# Patient Record
Sex: Male | Born: 1975 | Hispanic: Yes | Marital: Married | Smoking: Former smoker
Health system: Southern US, Community
[De-identification: ages and names within clinical notes are randomized; demographics above are authoritative.]

## PROBLEM LIST (undated history)

## (undated) DIAGNOSIS — G510 Bell's palsy: Secondary | ICD-10-CM

## (undated) HISTORY — PX: OTHER SURGICAL HISTORY: SHX169

## (undated) HISTORY — PX: ANKLE SURGERY: SHX546

---

## 2017-04-19 ENCOUNTER — Encounter (HOSPITAL_COMMUNITY): Payer: Self-pay | Admitting: Emergency Medicine

## 2017-04-19 ENCOUNTER — Emergency Department (HOSPITAL_COMMUNITY)
Admission: EM | Admit: 2017-04-19 | Discharge: 2017-04-20 | Disposition: A | Discharge status: Home / Self Care | Payer: Self-pay | Attending: Emergency Medicine | Admitting: Emergency Medicine

## 2017-04-19 ENCOUNTER — Emergency Department (HOSPITAL_COMMUNITY): Payer: Self-pay

## 2017-04-19 ENCOUNTER — Other Ambulatory Visit: Payer: Self-pay

## 2017-04-19 DIAGNOSIS — M79644 Pain in right finger(s): Secondary | ICD-10-CM | POA: Insufficient documentation

## 2017-04-19 NOTE — ED Triage Notes (Signed)
Pt states that his middle finger on his R hand became swollen and red. Denies any injury.

## 2017-04-20 MED ORDER — IBUPROFEN 800 MG PO TABS
800.0000 mg | ORAL_TABLET | Freq: Once | ORAL | Status: AC
  Administered 2017-04-20: 800 mg via ORAL
  Filled 2017-04-20: qty 1

## 2017-04-20 MED ORDER — NAPROXEN 375 MG PO TABS: 375.0000 mg | ORAL_TABLET | Freq: Two times a day (BID) | ORAL | 0 refills | Status: DC

## 2017-04-20 NOTE — ED Provider Notes (Signed)
[facility] EMERGENCY DEPARTMENT Provider Note   CSN: [id number] Arrival date & time: 04/19/17  2119     History   Chief Complaint Chief Complaint  Patient presents with  . Hand Pain    HPI Brandon Woods is a 42 y.o. male.  Patient presents with sudden onset of right middle finger pain about 1400 on 04/19/17.Marland Kitchen No known injury. Works as a Financial risk analyst. Pain primarily along the middle and distal phalanx. No increased warmth, redness, or swelling.  The history is provided by the patient. No language interpreter was used.  Hand Pain  This is a new problem. The current episode started 6 to 12 hours ago. The problem has not changed since onset.   History reviewed. No pertinent past medical history.  There are no active problems to display for this patient.   History reviewed. No pertinent surgical history.      Home Medications    Prior to Admission medications   Not on File    Family History No family history on file.  Social History Social History   Tobacco Use  . Smoking status: Never Smoker  . Smokeless tobacco: Never Used  Substance Use Topics  . Alcohol use: Not Currently  . Drug use: Not Currently     Allergies   Patient has no known allergies.   Review of Systems Review of Systems  Musculoskeletal: Positive for arthralgias.  All other systems reviewed and are negative.    Physical Exam Updated Vital Signs BP 121/75 (BP Location: Left Arm)   Pulse 67   Temp 98.8 F (37.1 C) (Oral)   Resp 18   SpO2 100%   Physical Exam  Constitutional: He is oriented to person, place, and time. He appears well-developed and well-nourished.  HENT:  Head: Normocephalic.  Eyes: Conjunctivae are normal.  Neck: Normal range of motion.  Cardiovascular: Normal rate and regular rhythm.  Pulmonary/Chest: Effort normal and breath sounds normal.  Abdominal: Soft. Bowel sounds are normal.  Musculoskeletal: Normal range of motion. He exhibits  tenderness. He exhibits no edema.       Right hand: He exhibits tenderness. He exhibits normal range of motion. Normal sensation noted. Normal strength noted.       Hands: Neurological: He is alert and oriented to person, place, and time.  Skin: Skin is warm and dry.  Nursing note and vitals reviewed.    ED Treatments / Results  Labs (all labs ordered are listed, but only abnormal results are displayed) Labs Reviewed - No data to display  EKG None  Radiology Dg Finger Middle Right  Result Date: 04/19/2017 CLINICAL DATA:  Pain rt middle finger today, NKI EXAM: RIGHT MIDDLE FINGER 2+V COMPARISON:  None. FINDINGS: No fracture dislocation of the middle digit.  No foreign body. IMPRESSION: No acute osseous abnormality. Electronically Signed   By: Genevive Bi M.D.   On: 04/19/2017 22:31    Procedures Procedures (including critical care time)  Medications Ordered in ED Medications - No data to display   Initial Impression / Assessment and Plan / ED Course  I have reviewed the triage vital signs and the nursing notes.  Pertinent labs & imaging results that were available during my care of the patient were reviewed by me and considered in my medical decision making (see chart for details).     Patient X-Ray negative for obvious fracture or dislocation.  Pt advised to follow up with orthopedics if symptoms do not improve with treatment. Patient given splint  while in ED, conservative therapy recommended and discussed. Patient will be discharged home & is agreeable with above plan. Returns precautions discussed. Pt appears safe for discharge.  Final Clinical Impressions(s) / ED Diagnoses   Final diagnoses:  Pain in finger of right hand    ED Discharge Orders        Ordered    naproxen (NAPROSYN) 375 MG tablet  2 times daily     04/20/17 0027       Felicie MornSmith, Keviana Guida, NP 04/20/17 [id number]    Shon BatonHorton, Courtney F, MD 04/20/17 [phone]

## 2017-04-20 NOTE — Discharge Instructions (Signed)
Take medication as directed. Follow-up with the provider listed if no improvement in symptoms despite treatment.

## 2017-05-01 ENCOUNTER — Other Ambulatory Visit: Payer: Self-pay

## 2017-05-01 ENCOUNTER — Encounter (HOSPITAL_COMMUNITY): Payer: Self-pay | Admitting: *Deleted

## 2017-05-01 ENCOUNTER — Emergency Department (HOSPITAL_COMMUNITY)
Admission: EM | Admit: 2017-05-01 | Discharge: 2017-05-01 | Disposition: A | Discharge status: Home / Self Care | Payer: No Typology Code available for payment source | Attending: Emergency Medicine | Admitting: Emergency Medicine

## 2017-05-01 DIAGNOSIS — W268XXA Contact with other sharp object(s), not elsewhere classified, initial encounter: Secondary | ICD-10-CM | POA: Insufficient documentation

## 2017-05-01 DIAGNOSIS — S61210A Laceration without foreign body of right index finger without damage to nail, initial encounter: Secondary | ICD-10-CM | POA: Diagnosis not present

## 2017-05-01 DIAGNOSIS — Y990 Civilian activity done for income or pay: Secondary | ICD-10-CM | POA: Diagnosis not present

## 2017-05-01 DIAGNOSIS — Z23 Encounter for immunization: Secondary | ICD-10-CM | POA: Diagnosis not present

## 2017-05-01 DIAGNOSIS — Y9389 Activity, other specified: Secondary | ICD-10-CM | POA: Insufficient documentation

## 2017-05-01 DIAGNOSIS — Y9289 Other specified places as the place of occurrence of the external cause: Secondary | ICD-10-CM | POA: Diagnosis not present

## 2017-05-01 DIAGNOSIS — S6991XA Unspecified injury of right wrist, hand and finger(s), initial encounter: Secondary | ICD-10-CM | POA: Diagnosis present

## 2017-05-01 MED ORDER — LIDOCAINE HCL (PF) 1 % IJ SOLN: 5.0000 mL | Freq: Once | INTRAMUSCULAR | Status: DC

## 2017-05-01 MED ORDER — TETANUS-DIPHTH-ACELL PERTUSSIS 5-2.5-18.5 LF-MCG/0.5 IM SUSP
0.5000 mL | Freq: Once | INTRAMUSCULAR | Status: AC
  Administered 2017-05-01: 0.5 mL via INTRAMUSCULAR
  Filled 2017-05-01: qty 0.5

## 2017-05-01 NOTE — Discharge Instructions (Addendum)
Get help right away if: Your wound reopens and is draining. Your wound becomes red, swollen, hot, or tender. You develop a rash after the glue is applied. You have increasing pain in the wound. You have a red streak going away from the wound. You have pus coming from the wound. You have increased bleeding. You have a fever. You have shaking chills. You notice a bad smell coming from the wound. Your wound or the adhesive breaks open. 

## 2017-05-01 NOTE — ED Notes (Signed)
Patient here with small laceration to right index finger at nailbed after cutting on metal frame 6 hours ago, no bleeding, full sensation

## 2017-05-01 NOTE — ED Provider Notes (Signed)
[facility] EMERGENCY DEPARTMENT Provider Note   CSN: [id number] Arrival date & time: 05/01/17  6440     History   Chief Complaint Chief Complaint  Patient presents with  . Laceration    HPI Brandon Woods is a 42 y.o. male who presents with cc of laceration to the right index finger.  This occurred about 4 hours prior to arrival.  Patient was at work and sliced his right index finger on a metal edge.  He cleaned the wound and applied direct pressure.  He is unsure of his last tetanus vaccination.  Bleeding is currently controlled.  He denies any numbness or tingling.  He denies any weakness, easy bruising or bleeding.  HPI  History reviewed. No pertinent past medical history.  There are no active problems to display for this patient.   History reviewed. No pertinent surgical history.      Home Medications    Prior to Admission medications   Medication Sig Start Date End Date Taking? Authorizing Provider  naproxen (NAPROSYN) 375 MG tablet Take 1 tablet (375 mg total) by mouth 2 (two) times daily. 04/20/17   Felicie Morn, NP    Family History No family history on file.  Social History Social History   Tobacco Use  . Smoking status: Never Smoker  . Smokeless tobacco: Never Used  Substance Use Topics  . Alcohol use: Not Currently  . Drug use: Not Currently     Allergies   Patient has no known allergies.   Review of Systems Review of Systems  Positive for wound, negative for numbness, weakness, tingling or bleeding Physical Exam Updated Vital Signs BP 110/68   Pulse 72   Temp 98.5 F (36.9 C) (Oral)   Resp 18   Ht  (1.651 m)   Wt 81.6 kg (180 lb)   SpO2 99%   BMI 29.95 kg/m   Physical Exam  Physical Exam  Nursing note and vitals reviewed. Constitutional: He appears well-developed and well-nourished. No distress.  HENT:  Head: Normocephalic and atraumatic.  Eyes: Conjunctivae normal are normal. No scleral icterus.    Neck: Normal range of motion. Neck supple.  Cardiovascular: Normal rate, regular rhythm and normal heart sounds.   Pulmonary/Chest: Effort normal and breath sounds normal. No respiratory distress.  Abdominal: Soft. There is no tenderness.  Musculoskeletal: He exhibits no edema.  1 cm elliptical laceration of the distal R index finger, superficial Neurological: He is alert.  Skin: Skin is warm and dry. He is not diaphoretic.  Psychiatric: His behavior is normal.    ED Treatments / Results  Labs (all labs ordered are listed, but only abnormal results are displayed) Labs Reviewed - No data to display  EKG None  Radiology No results found.  Procedures Procedures (including critical care time)  Medications Ordered in ED Medications  Tdap (BOOSTRIX) injection 0.5 mL (0.5 mLs Intramuscular Given 05/01/17 0831)   LACERATION REPAIR Performed by: Arthor Captain Authorized by: Arthor Captain Consent: Verbal consent obtained. Risks and benefits: risks, benefits and alternatives were discussed Consent given by: patient Patient identity confirmed: provided demographic data Prepped and Draped in normal sterile fashion Wound explored  Laceration Location: index finger, R  Laceration Length: 1cm  No Foreign Bodies seen or palpated  Anesthesia: local infiltration  Local anesthetic: None    Irrigation method: syringe Amount of cleaning: standard  Skin closure: tissue adhesive   Patient tolerance: Patient tolerated the procedure well with no immediate complications.   SPLINT APPLICATION  Date/Time: 9:01 AM Authorized by: Arthor Captain Consent: Verbal consent obtained. Risks and benefits: risks, benefits and alternatives were discussed Consent given by: patient Splint applied by: Delos Haring Location details: Right index finger Splint type: Baseball finger splint Supplies used: Prefabricated splint Post-procedure: The splinted body part was neurovascularly  unchanged following the procedure. Patient tolerance: Patient tolerated the procedure well with no immediate complications.  ' Initial Impression / Assessment and Plan / ED Course  I have reviewed the triage vital signs and the nursing notes.  Pertinent labs & imaging results that were available during my care of the patient were reviewed by me and considered in my medical decision making (see chart for details).     Patient with finger laceration, no evidence of vascular or nerve compromise.  No need for sutures.  Patient repaired with tissue adhesive.  Discussed wound care and return precautions.  He appears appropriate for discharge at this time  Final Clinical Impressions(s) / ED Diagnoses   Final diagnoses:  Laceration of right index finger without foreign body, nail damage status unspecified, initial encounter    ED Discharge Orders    None       Arthor Captain, PA-C 05/01/17 Theodore Demark    Shaune Pollack, MD 05/02/17 [phone]

## 2017-05-01 NOTE — ED Triage Notes (Signed)
Pt is c/o  A lacerated rt index finger on a piece of metal at work tonight dandaged  Bleeding controlled

## 2018-01-01 ENCOUNTER — Emergency Department (HOSPITAL_COMMUNITY): Payer: Medicaid Other

## 2018-01-01 ENCOUNTER — Emergency Department (HOSPITAL_COMMUNITY)
Admission: EM | Admit: 2018-01-01 | Discharge: 2018-01-01 | Disposition: A | Discharge status: Home / Self Care | Payer: Medicaid Other | Attending: Emergency Medicine | Admitting: Emergency Medicine

## 2018-01-01 DIAGNOSIS — R079 Chest pain, unspecified: Secondary | ICD-10-CM | POA: Diagnosis not present

## 2018-01-01 DIAGNOSIS — R0789 Other chest pain: Secondary | ICD-10-CM | POA: Insufficient documentation

## 2018-01-01 DIAGNOSIS — R112 Nausea with vomiting, unspecified: Secondary | ICD-10-CM

## 2018-01-01 DIAGNOSIS — R197 Diarrhea, unspecified: Secondary | ICD-10-CM | POA: Insufficient documentation

## 2018-01-01 DIAGNOSIS — R1013 Epigastric pain: Secondary | ICD-10-CM | POA: Diagnosis present

## 2018-01-01 LAB — I-STAT TROPONIN, ED: TROPONIN I, POC: 0 ng/mL (ref 0.00–0.08)

## 2018-01-01 LAB — TROPONIN I

## 2018-01-01 LAB — BASIC METABOLIC PANEL
ANION GAP: 8 (ref 5–15)
BUN: 10 mg/dL (ref 6–20)
CHLORIDE: 105 mmol/L (ref 98–111)
CO2: 26 mmol/L (ref 22–32)
Calcium: 9.2 mg/dL (ref 8.9–10.3)
Creatinine, Ser: 0.75 mg/dL (ref 0.61–1.24)
Glucose, Bld: 159 mg/dL — ABNORMAL HIGH (ref 70–99)
POTASSIUM: 3.1 mmol/L — AB (ref 3.5–5.1)
SODIUM: 139 mmol/L (ref 135–145)

## 2018-01-01 LAB — CBC
HEMATOCRIT: 39.7 % (ref 39.0–52.0)
HEMOGLOBIN: 13.5 g/dL (ref 13.0–17.0)
MCH: 29.3 pg (ref 26.0–34.0)
MCHC: 34 g/dL (ref 30.0–36.0)
MCV: 86.3 fL (ref 80.0–100.0)
NRBC: 0 % (ref 0.0–0.2)
Platelets: 212 10*3/uL (ref 150–400)
RBC: 4.6 MIL/uL (ref 4.22–5.81)
RDW: 12.8 % (ref 11.5–15.5)
WBC: 7.7 10*3/uL (ref 4.0–10.5)

## 2018-01-01 LAB — LIPASE, BLOOD: Lipase: 18 U/L (ref 11–51)

## 2018-01-01 LAB — HEPATIC FUNCTION PANEL
ALT: 21 U/L (ref 0–44)
AST: 22 U/L (ref 15–41)
Albumin: 4 g/dL (ref 3.5–5.0)
Alkaline Phosphatase: 60 U/L (ref 38–126)
BILIRUBIN DIRECT: 0.1 mg/dL (ref 0.0–0.2)
Indirect Bilirubin: 0.5 mg/dL (ref 0.3–0.9)
TOTAL PROTEIN: 7.5 g/dL (ref 6.5–8.1)
Total Bilirubin: 0.6 mg/dL (ref 0.3–1.2)

## 2018-01-01 LAB — BRAIN NATRIURETIC PEPTIDE: B NATRIURETIC PEPTIDE 5: 5.3 pg/mL (ref 0.0–100.0)

## 2018-01-01 MED ORDER — LIDOCAINE VISCOUS HCL 2 % MT SOLN
15.0000 mL | Freq: Once | OROMUCOSAL | Status: AC
  Administered 2018-01-01: 15 mL via ORAL
  Filled 2018-01-01: qty 15

## 2018-01-01 MED ORDER — POTASSIUM CHLORIDE CRYS ER 20 MEQ PO TBCR
40.0000 meq | EXTENDED_RELEASE_TABLET | Freq: Once | ORAL | Status: AC
  Administered 2018-01-01: 40 meq via ORAL
  Filled 2018-01-01: qty 2

## 2018-01-01 MED ORDER — ALUM & MAG HYDROXIDE-SIMETH 200-200-20 MG/5ML PO SUSP
30.0000 mL | Freq: Once | ORAL | Status: AC
  Administered 2018-01-01: 30 mL via ORAL
  Filled 2018-01-01: qty 30

## 2018-01-01 MED ORDER — DICYCLOMINE HCL 10 MG/5ML PO SOLN
10.0000 mg | Freq: Once | ORAL | Status: AC
  Administered 2018-01-01: 10 mg via ORAL
  Filled 2018-01-01: qty 5

## 2018-01-01 MED ORDER — ONDANSETRON 4 MG PO TBDP: 4.0000 mg | ORAL_TABLET | Freq: Three times a day (TID) | ORAL | 0 refills | Status: DC | PRN

## 2018-01-01 MED ORDER — DICYCLOMINE HCL 20 MG PO TABS: 20.0000 mg | ORAL_TABLET | Freq: Three times a day (TID) | ORAL | 0 refills | Status: DC

## 2018-01-01 NOTE — ED Triage Notes (Signed)
Patient says he experienced chest pain, and vomited while at home. Pt states he also had 2 episodes of diarrhea tonight. Denies sob lightlessness, dizziness.

## 2018-01-01 NOTE — ED Provider Notes (Addendum)
[facility] Provider Note   CSN: [id number] Arrival date & time: 01/01/18  0158     History   Chief Complaint Chief Complaint  Patient presents with  . Chest Pain    HPI Rande Lawmanomas Corlew is a 42 y.o. male.  HPI   42 yo M here with abdominal pain, nausea, vomiting, diarrhea. Pt states that he was in his usual state of health until this afternoon. He was eating dinner (chicken, noodles) when he experienced abrupt onset of epigastric and substernal chest pressure, followed by 2 episodes of NBNB emesis. He then had 2 episodes of NB diarrhea. Since then, he's had intermittent cramp like pain in his epigastric area, with belching. No fever or chills. He was well prior to sx onset. His wife ate the same food and is not sick. No h/o gallstones. No cardiac history. No SOB, diaphoresis. Has not tried anything for relief.  No past medical history on file.  There are no active problems to display for this patient.   No past surgical history on file.      Home Medications    Prior to Admission medications   Medication Sig Start Date End Date Taking? Authorizing Provider  naproxen (NAPROSYN) 375 MG tablet Take 1 tablet (375 mg total) by mouth 2 (two) times daily. Patient not taking: Reported on 01/01/2018 04/20/17   Felicie MornSmith, David, NP    Family History No family history on file.  Social History Social History   Tobacco Use  . Smoking status: Never Smoker  . Smokeless tobacco: Never Used  Substance Use Topics  . Alcohol use: Not Currently  . Drug use: Not Currently     Allergies   Patient has no known allergies.   Review of Systems Review of Systems  Constitutional: Negative for chills, fatigue and fever.  HENT: Negative for congestion and rhinorrhea.   Eyes: Negative for visual disturbance.  Respiratory: Positive for chest tightness. Negative for cough, shortness of breath and wheezing.   Cardiovascular: Negative for chest pain and  leg swelling.  Gastrointestinal: Positive for diarrhea, nausea and vomiting. Negative for abdominal pain.  Genitourinary: Negative for dysuria and flank pain.  Musculoskeletal: Negative for neck pain and neck stiffness.  Skin: Negative for rash and wound.  Allergic/Immunologic: Negative for immunocompromised state.  Neurological: Negative for syncope, weakness, numbness and headaches.  Hematological: Does not bruise/bleed easily.  All other systems reviewed and are negative.    Physical Exam Updated Vital Signs BP (!) 94/56   Pulse 71   Temp 98.8 F (37.1 C) (Oral)   Resp (!) 24   SpO2 98%   Physical Exam Vitals signs and nursing note reviewed.  Constitutional:      General: He is not in acute distress.    Appearance: He is well-developed.  HENT:     Head: Normocephalic and atraumatic.  Eyes:     Conjunctiva/sclera: Conjunctivae normal.  Neck:     Musculoskeletal: Neck supple.  Cardiovascular:     Rate and Rhythm: Normal rate and regular rhythm.     Heart sounds: Normal heart sounds. No murmur. No friction rub.  Pulmonary:     Effort: Pulmonary effort is normal. No respiratory distress.     Breath sounds: Normal breath sounds. No wheezing or rales.  Abdominal:     General: Bowel sounds are increased. There is no distension.     Palpations: Abdomen is soft.     Tenderness: There is abdominal tenderness (mild) in the  epigastric area. There is no guarding or rebound.  Skin:    General: Skin is warm.     Capillary Refill: Capillary refill takes less than 2 seconds.  Neurological:     Mental Status: He is alert and oriented to person, place, and time.     Motor: No abnormal muscle tone.      ED Treatments / Results  Labs (all labs ordered are listed, but only abnormal results are displayed) Labs Reviewed  BASIC METABOLIC PANEL - Abnormal; Notable for the following components:      Result Value   Potassium 3.1 (*)    Glucose, Bld 159 (*)    All other components  within normal limits  CBC  HEPATIC FUNCTION PANEL  LIPASE, BLOOD  TROPONIN I  BRAIN NATRIURETIC PEPTIDE  I-STAT TROPONIN, ED    EKG EKG Interpretation  Date/Time:  Sunday January 01 2018 02:11:27 EST Ventricular Rate:  93 PR Interval:  152 QRS Duration: 110 QT Interval:  352 QTC Calculation: 437 R Axis:   58 Text Interpretation:  Normal sinus rhythm Normal ECG No old tracing to compare Confirmed by Kenrick Pore ([id number]) on 01/01/2018 3:31:16 AM   Radiology Dg Chest 2 View  Result Date: 01/01/2018 CLINICAL DATA:  Acute onset of central chest pain and vomiting. Diarrhea. EXAM: CHEST - 2 VIEW COMPARISON:  None. FINDINGS: The lungs are well-aerated. Vascular congestion is noted. Mildly increased interstitial markings may be chronic in nature. There is no evidence of pleural effusion or pneumothorax. The heart is borderline normal in size. No acute osseous abnormalities are seen. IMPRESSION: Vascular congestion noted. Mildly increased interstitial markings may be chronic in nature. Lungs otherwise clear. Electronically Signed   By: Jeffery  Chang M.D.   On: 01/01/2018 03:06    Procedures Procedures (including critical care time)  Medications Ordered in ED Medications  potassium chloride SA (K-DUR,KLOR-CON) CR tablet 40 mEq (has no administration in time range)  alum & mag hydroxide-simeth (MAALOX/MYLANTA) 200-200-20 MG/5ML suspension 30 mL (30 mLs Oral Given 01/01/18 0450)    And  lidocaine (XYLOCAINE) 2 % viscous mouth solution 15 mL (15 mLs Oral Given 01/01/18 0450)  dicyclomine (BENTYL) 10 MG/5ML syrup 10 mg (10 mg Oral Given 01/01/18 0450)     Initial Impression / Assessment and Plan / ED Course  I have reviewed the triage vital signs and the nursing notes.  Pertinent labs & imaging results that were available during my care of the patient were reviewed by me and considered in my medical decision making (see chart for details).     42  yo M here with mild  epigastric/substernal pain, n/v/d. Known sick contacts at work. VS are stable. On exam, he has minimal TTP with no RUQ TTP or signs of cholecystitis. Labs reviewed - WBC normal, normal LFTs and Lipase - doubt cholecystitis, pancreatitis, or other acute intra-abdominal pathology. EKG non-ischemic, CXR negative, trop neg despite constant sx, doubt ACS. He is PERC neg, no SOB, no tachypnea, tachycardia, or cough, doubt PE.   Feels markedly improved w/ GI meds. Will d/c with supportive care.  Of note, CXR with interstitial markings, ? Of vascular congestion. He is a former smoker w/ chronic cough. No edema, DOE, normal BNP and trop - doubt CHF. Will just f/u with PCP.   Final Clinical Impressions(s) / ED Diagnoses   Final diagnoses:  Nausea vomiting and diarrhea  Atypical chest pain    ED Discharge Orders    None  Shaune PollackIsaacs, Ronnisha Felber, MD 01/01/18 [id number]    Shaune PollackIsaacs, Giann Obara, MD 01/01/18 [phone]

## 2018-01-04 ENCOUNTER — Encounter (HOSPITAL_COMMUNITY): Payer: Self-pay | Admitting: Emergency Medicine

## 2018-01-04 ENCOUNTER — Ambulatory Visit (HOSPITAL_COMMUNITY)
Admission: EM | Admit: 2018-01-04 | Discharge: 2018-01-04 | Disposition: A | Discharge status: Home / Self Care | Payer: Worker's Compensation | Attending: Family Medicine | Admitting: Family Medicine

## 2018-01-04 ENCOUNTER — Other Ambulatory Visit: Payer: Self-pay

## 2018-01-04 DIAGNOSIS — S20419A Abrasion of unspecified back wall of thorax, initial encounter: Secondary | ICD-10-CM | POA: Insufficient documentation

## 2018-01-04 DIAGNOSIS — Z87898 Personal history of other specified conditions: Secondary | ICD-10-CM | POA: Insufficient documentation

## 2018-01-04 NOTE — ED Provider Notes (Signed)
[facility]   [id number] 01/04/18 Arrival Time: 1938  CC: Back pain  SUBJECTIVE: History from: patient. Lora Knetter is a 43 y.o. male complains of mid back pain that began earlier today.  This occurred after he backed into a door handle.  Localizes the pain to the midback.  Describes the pain as mild.  Denies aggravating or alleviating factors.  Denies similar symptoms in the past.  Does report a brief episode of dizziness afterwards.  As if the room was spinning.  Patient states he does not drink "a lot of water" and believes this may have contributed to his symptoms.  Symptoms resolved with drinking water. Pt seen in the ED on 01/01/18 for chest pain with negative work-up.  According to ED note: "negative WBC, normal LFTs and lipase, EKG non-ischemic, CXR negative, troponin negative."  Denies fever, chills, LOC, HA, blurred vision, slurred speech, limb weakness, CP, SOB, nausea, vomiting, erythema, ecchymosis, effusion, weakness, numbness and tingling.      ROS: As per HPI.  History reviewed. No pertinent past medical history. Past Surgical History:  Procedure Laterality Date  . ANKLE SURGERY Left   . arm surgery Left    No Known Allergies No current facility-administered medications on file prior to encounter.    Current Outpatient Medications on File Prior to Encounter  Medication Sig Dispense Refill  . dicyclomine (BENTYL) 20 MG tablet Take 1 tablet (20 mg total) by mouth 4 (four) times daily -  before meals and at bedtime. 20 tablet 0  . naproxen (NAPROSYN) 375 MG tablet Take 1 tablet (375 mg total) by mouth 2 (two) times daily. (Patient not taking: Reported on 01/01/2018) 20 tablet 0   Social History   Socioeconomic History  . Marital status: Married    Spouse name: Not on file  . Number of children: Not on file  . Years of education: Not on file  . Highest education level: Not on file  Occupational History  . Not on file  Social Needs  . Financial resource  strain: Not on file  . Food insecurity:    Worry: Not on file    Inability: Not on file  . Transportation needs:    Medical: Not on file    Non-medical: Not on file  Tobacco Use  . Smoking status: Never Smoker  . Smokeless tobacco: Never Used  Substance and Sexual Activity  . Alcohol use: Not Currently  . Drug use: Not Currently  . Sexual activity: Not on file  Lifestyle  . Physical activity:    Days per week: Not on file    Minutes per session: Not on file  . Stress: Not on file  Relationships  . Social connections:    Talks on phone: Not on file    Gets together: Not on file    Attends religious service: Not on file    Active member of club or organization: Not on file    Attends meetings of clubs or organizations: Not on file    Relationship status: Not on file  . Intimate partner violence:    Fear of current or ex partner: Not on file    Emotionally abused: Not on file    Physically abused: Not on file    Forced sexual activity: Not on file  Other Topics Concern  . Not on file  Social History Narrative  . Not on file   Family History  Problem Relation Age of Onset  . Healthy Father  OBJECTIVE:  Vitals:   01/04/18 1956  BP: 112/71  Pulse: 60  Resp: 20  Temp: 98.4 F (36.9 C)  TempSrc: Oral  SpO2: 100%    General appearance: AOx3; in no acute distress.  Head: NCAT Lungs: CTA bilaterally Heart: RRR.  Clear S1 and S2 without murmur, gallops, or rubs.  Radial pulses 2+ bilaterally. Musculoskeletal:  Inspection: Small superficial abrasion localized to mid thoracic spine approximately 1 cm in diameter Palpation: Nontender to palpation; no midline tenderness ROM: FROM active and passive Strength: 5/5 shld abduction, 5/5 shld adduction, 5/5 elbow flexion, 5/5 elbow extension, 5/5 grip strength, 5/5 hip flexion, 5/5 knee abduction, 5/5 knee adduction, 5/5 knee flexion, 5/5 knee extension Skin: warm and dry Neurologic: CN grossly intact; Ambulates without  difficulty; Sensation intact about the upper/ lower extremities Psychological: alert and cooperative; normal mood and affect  ASSESSMENT & PLAN:  1. Abrasion of back wall of thorax, initial encounter   2. Hx of dizziness     Brief episode of dizziness after backing in door handle.  VS stable, unremarkable work-up in ED a few day prior, currently asymptomatic. PE unremarkable today. Symptoms most likely related to dehydration.  Will follow up with PCP for further evaluation and management.    Rest and push fluids Eat a well balanced diet of fruits, vegetables and lean meats.  Avoid foods high in fat and salt Drink water.  At least half your body weight in ounces Exercise for at least 30 minutes daily Take OTC ibuprofen and/or tylenol as needed for pain Follow up with PCP for further evaluation and management of hx of dizziness Return or go to the ED if you have any new or worsening symptoms such as vision changes, fatigue, dizziness, chest pain, shortness of breath, nausea, swelling in your hands or feet, urinary symptoms, etc...   Reviewed expectations re: course of current medical issues. Questions answered. Outlined signs and symptoms indicating need for more acute intervention. Patient verbalized understanding. After Visit Summary given.    Rennis HardingWurst, Darryn Kydd, PA-C 01/04/18 2032

## 2018-01-04 NOTE — ED Triage Notes (Signed)
Patient has back pain, pain is middle of back.  Patient reports stepping backwards into a door at work

## 2018-01-04 NOTE — Discharge Instructions (Addendum)
Rest and push fluids Eat a well balanced diet of fruits, vegetables and lean meats.  Avoid foods high in fat and salt Drink water.  At least half your body weight in ounces Exercise for at least 30 minutes daily Take OTC ibuprofen and/or tylenol as needed for pain Follow up with PCP for further evaluation and management of dizziness Return or go to the ED if you have any new or worsening symptoms such as vision changes, fatigue, dizziness, chest pain, shortness of breath, nausea, swelling in your hands or feet, urinary symptoms, etc...Marland Kitchen

## 2018-03-15 DIAGNOSIS — K219 Gastro-esophageal reflux disease without esophagitis: Secondary | ICD-10-CM | POA: Diagnosis not present

## 2018-03-15 DIAGNOSIS — R7301 Impaired fasting glucose: Secondary | ICD-10-CM | POA: Diagnosis not present

## 2018-03-15 DIAGNOSIS — Z0189 Encounter for other specified special examinations: Secondary | ICD-10-CM | POA: Diagnosis not present

## 2018-03-29 DIAGNOSIS — E781 Pure hyperglyceridemia: Secondary | ICD-10-CM | POA: Diagnosis not present

## 2018-03-29 DIAGNOSIS — K219 Gastro-esophageal reflux disease without esophagitis: Secondary | ICD-10-CM | POA: Diagnosis not present

## 2018-03-29 DIAGNOSIS — Z0000 Encounter for general adult medical examination without abnormal findings: Secondary | ICD-10-CM | POA: Diagnosis not present

## 2018-10-02 ENCOUNTER — Other Ambulatory Visit: Payer: Self-pay

## 2018-10-02 ENCOUNTER — Encounter (HOSPITAL_COMMUNITY): Payer: Self-pay | Admitting: Emergency Medicine

## 2018-10-02 DIAGNOSIS — Z5321 Procedure and treatment not carried out due to patient leaving prior to being seen by health care provider: Secondary | ICD-10-CM | POA: Insufficient documentation

## 2018-10-02 DIAGNOSIS — R42 Dizziness and giddiness: Secondary | ICD-10-CM | POA: Diagnosis present

## 2018-10-02 LAB — CBG MONITORING, ED: Glucose-Capillary: 164 mg/dL — ABNORMAL HIGH (ref 70–99)

## 2018-10-02 MED ORDER — SODIUM CHLORIDE 0.9% FLUSH: 3.0000 mL | Freq: Once | INTRAVENOUS | Status: DC

## 2018-10-02 NOTE — ED Triage Notes (Signed)
Patient here from home with complaints of dizziness sudden onset today. Reports that he has not been drinking water.

## 2018-10-03 ENCOUNTER — Emergency Department (HOSPITAL_COMMUNITY)
Admission: EM | Admit: 2018-10-03 | Discharge: 2018-10-03 | Discharge status: Against Medical Advice | Payer: Medicaid Other | Attending: Emergency Medicine | Admitting: Emergency Medicine

## 2018-10-03 LAB — CBC
HCT: 40.3 % (ref 39.0–52.0)
Hemoglobin: 13.7 g/dL (ref 13.0–17.0)
MCH: 29.7 pg (ref 26.0–34.0)
MCHC: 34 g/dL (ref 30.0–36.0)
MCV: 87.2 fL (ref 80.0–100.0)
Platelets: 227 10*3/uL (ref 150–400)
RBC: 4.62 MIL/uL (ref 4.22–5.81)
RDW: 12.9 % (ref 11.5–15.5)
WBC: 8.5 10*3/uL (ref 4.0–10.5)
nRBC: 0 % (ref 0.0–0.2)

## 2018-10-03 LAB — BASIC METABOLIC PANEL
Anion gap: 9 (ref 5–15)
BUN: 9 mg/dL (ref 6–20)
CO2: 23 mmol/L (ref 22–32)
Calcium: 9.1 mg/dL (ref 8.9–10.3)
Chloride: 105 mmol/L (ref 98–111)
Creatinine, Ser: 0.57 mg/dL — ABNORMAL LOW (ref 0.61–1.24)
GFR calc Af Amer: >60 mL/min (ref >60)
GFR calc non Af Amer: >60 mL/min (ref >60)
Glucose, Bld: 151 mg/dL — ABNORMAL HIGH (ref 70–99)
Potassium: 3.4 mmol/L — ABNORMAL LOW (ref 3.5–5.1)
Sodium: 137 mmol/L (ref 135–145)

## 2018-11-24 ENCOUNTER — Encounter (HOSPITAL_COMMUNITY): Payer: Self-pay

## 2018-11-24 ENCOUNTER — Ambulatory Visit (HOSPITAL_COMMUNITY): Payer: Medicaid Other

## 2018-11-24 ENCOUNTER — Other Ambulatory Visit: Payer: Self-pay

## 2018-11-24 ENCOUNTER — Ambulatory Visit (HOSPITAL_COMMUNITY)
Admission: EM | Admit: 2018-11-24 | Discharge: 2018-11-24 | Disposition: A | Discharge status: Home / Self Care | Payer: Medicaid Other | Attending: Urgent Care | Admitting: Urgent Care

## 2018-11-24 ENCOUNTER — Ambulatory Visit (INDEPENDENT_AMBULATORY_CARE_PROVIDER_SITE_OTHER): Payer: Medicaid Other

## 2018-11-24 DIAGNOSIS — M545 Low back pain, unspecified: Secondary | ICD-10-CM

## 2018-11-24 DIAGNOSIS — W19XXXA Unspecified fall, initial encounter: Secondary | ICD-10-CM | POA: Diagnosis not present

## 2018-11-24 DIAGNOSIS — R109 Unspecified abdominal pain: Secondary | ICD-10-CM | POA: Diagnosis not present

## 2018-11-24 DIAGNOSIS — M25552 Pain in left hip: Secondary | ICD-10-CM

## 2018-11-24 DIAGNOSIS — S39012A Strain of muscle, fascia and tendon of lower back, initial encounter: Secondary | ICD-10-CM

## 2018-11-24 DIAGNOSIS — S7002XA Contusion of left hip, initial encounter: Secondary | ICD-10-CM | POA: Diagnosis not present

## 2018-11-24 DIAGNOSIS — S79912A Unspecified injury of left hip, initial encounter: Secondary | ICD-10-CM | POA: Diagnosis not present

## 2018-11-24 MED ORDER — IBUPROFEN 800 MG PO TABS
800.0000 mg | ORAL_TABLET | Freq: Once | ORAL | Status: AC
  Administered 2018-11-24: 19:00:00 800 mg via ORAL

## 2018-11-24 MED ORDER — IBUPROFEN 800 MG PO TABS
ORAL_TABLET | ORAL | Status: AC
  Filled 2018-11-24: qty 1

## 2018-11-24 MED ORDER — CYCLOBENZAPRINE HCL 5 MG PO TABS: 5.0000 mg | ORAL_TABLET | Freq: Three times a day (TID) | ORAL | 0 refills | Status: DC | PRN

## 2018-11-24 MED ORDER — NAPROXEN 500 MG PO TABS: 500.0000 mg | ORAL_TABLET | Freq: Two times a day (BID) | ORAL | 0 refills | Status: DC

## 2018-11-24 MED ORDER — IBUPROFEN 100 MG/5ML PO SUSP
ORAL | Status: AC
  Filled 2018-11-24: qty 40

## 2018-11-24 NOTE — ED Triage Notes (Signed)
Patient presents to Urgent Care with complaints of falling off of a bucket he was standing on since this afternoon at work. Patient reports he fell on his left hip, which he has broken in the past. Pt ow has generalized back pain, ambulatory with a limp.

## 2018-11-24 NOTE — ED Provider Notes (Signed)
  Brandon Woods   MRN: [id number] DOB: 01-Mar-1975  Subjective:   Brandon Woods is a 43 y.o. male presenting for suffering a fall today while at work. 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 He has since had pain that is aching, moderate, 7/10. Brandon Woods has a medication list, allergies, pmh and psh that were reviewed, updated as appropriate and not included due to being a worker's injury case.   ROS   Objective:   Vitals: BP 124/81 (BP Location: Right Arm)   Pulse 70   Temp 98.4 F (36.9 C) (Oral)   Resp 14   SpO2 100%   Physical Exam Constitutional:      Appearance: Normal appearance. He is well-developed and normal weight.  HENT:     Head: Normocephalic and atraumatic.     Right Ear: External ear normal.     Left Ear: External ear normal.     Nose: Nose normal.     Mouth/Throat:     Pharynx: Oropharynx is clear.  Eyes:     Extraocular Movements: Extraocular movements intact.     Pupils: Pupils are equal, round, and reactive to light.  Cardiovascular:     Rate and Rhythm: Normal rate.  Pulmonary:     Effort: Pulmonary effort is normal.  Musculoskeletal:     Left hip: He exhibits decreased range of motion (minimal) and tenderness (over area outlined). He exhibits normal strength, no bony tenderness, no swelling, no crepitus, no deformity and no laceration.     Lumbar back: He exhibits decreased range of motion (minimal) and tenderness (over area depicted). He exhibits no bony tenderness, no swelling, no edema, no deformity, no laceration and no spasm.       Back:       Legs:  Neurological:     Mental Status: He is alert and oriented to person, place, and time.  Psychiatric:        Mood and Affect: Mood normal.        Behavior: Behavior normal.     Dg Hip Unilat W Or Wo Pelvis 1 View Left  Result Date: 11/24/2018 CLINICAL DATA:  Left hip pain after fall today EXAM: DG HIP (WITH OR WITHOUT PELVIS) 1V*L* COMPARISON:   None. FINDINGS: No acute left hip fracture or dislocation. No acute pelvic fracture. Well corticated osseous fragments along the lateral left acetabular margin. No pelvic diastasis. No suspicious focal osseous lesions. No significant degenerative arthropathy in the left hip. IMPRESSION: No acute fracture. No left hip dislocation. Well corticated osseous fragments along the lateral left acetabular margin, favoring remote injury versus ossicles. Electronically Signed   By: Ilona Sorrel M.D.   On: 11/24/2018 19:21     Assessment and Plan :   1. Contusion of left hip, initial encounter   2. Fall, initial encounter   3. Left hip pain   4. Acute bilateral low back pain without sciatica   5. Right flank pain   6. Lumbar strain, initial encounter     Manage conservatively with NSAID and muscle relaxant for left hip contusion, lumbar strain.  Work restrictions provided.  If patient remains symptomatic, follow-up with occupational health. Counseled patient on potential for adverse effects with medications prescribed/recommended today, ER and return-to-clinic precautions discussed, patient verbalized understanding.    Brandon Woods, [place] 11/24/18 1936

## 2019-04-28 ENCOUNTER — Encounter (HOSPITAL_COMMUNITY): Payer: Self-pay | Admitting: Emergency Medicine

## 2019-04-28 ENCOUNTER — Other Ambulatory Visit: Payer: Self-pay

## 2019-04-28 ENCOUNTER — Emergency Department (HOSPITAL_COMMUNITY)
Admission: EM | Admit: 2019-04-28 | Discharge: 2019-04-29 | Disposition: A | Discharge status: Home / Self Care | Payer: Medicaid Other | Attending: Emergency Medicine | Admitting: Emergency Medicine

## 2019-04-28 ENCOUNTER — Emergency Department (HOSPITAL_COMMUNITY): Payer: Medicaid Other

## 2019-04-28 DIAGNOSIS — R42 Dizziness and giddiness: Secondary | ICD-10-CM | POA: Insufficient documentation

## 2019-04-28 DIAGNOSIS — R0789 Other chest pain: Secondary | ICD-10-CM | POA: Insufficient documentation

## 2019-04-28 DIAGNOSIS — Z5321 Procedure and treatment not carried out due to patient leaving prior to being seen by health care provider: Secondary | ICD-10-CM | POA: Insufficient documentation

## 2019-04-28 DIAGNOSIS — R079 Chest pain, unspecified: Secondary | ICD-10-CM | POA: Diagnosis not present

## 2019-04-28 LAB — CBC
HCT: 41 % (ref 39.0–52.0)
Hemoglobin: 13.8 g/dL (ref 13.0–17.0)
MCH: 29.1 pg (ref 26.0–34.0)
MCHC: 33.7 g/dL (ref 30.0–36.0)
MCV: 86.5 fL (ref 80.0–100.0)
Platelets: 199 10*3/uL (ref 150–400)
RBC: 4.74 MIL/uL (ref 4.22–5.81)
RDW: 12.7 % (ref 11.5–15.5)
WBC: 8.2 10*3/uL (ref 4.0–10.5)
nRBC: 0 % (ref 0.0–0.2)

## 2019-04-28 LAB — BASIC METABOLIC PANEL
Anion gap: 9 (ref 5–15)
BUN: 9 mg/dL (ref 6–20)
CO2: 26 mmol/L (ref 22–32)
Calcium: 9.4 mg/dL (ref 8.9–10.3)
Chloride: 105 mmol/L (ref 98–111)
Creatinine, Ser: 0.71 mg/dL (ref 0.61–1.24)
GFR calc Af Amer: >60 mL/min (ref >60)
GFR calc non Af Amer: >60 mL/min (ref >60)
Glucose, Bld: 105 mg/dL — ABNORMAL HIGH (ref 70–99)
Potassium: 4 mmol/L (ref 3.5–5.1)
Sodium: 140 mmol/L (ref 135–145)

## 2019-04-28 LAB — TROPONIN I (HIGH SENSITIVITY): Troponin I (High Sensitivity): 5 ng/L (ref <18)

## 2019-04-28 MED ORDER — SODIUM CHLORIDE 0.9% FLUSH: 3.0000 mL | Freq: Once | INTRAVENOUS | Status: DC

## 2019-04-28 NOTE — ED Triage Notes (Signed)
Pt states he tripped and fell onto ground while attempting to get out of truck today. Pt states after fall he sat down then had intermittent chest pain and dizziness a few times that since has resolved. Minor abrasion to R knee

## 2019-04-29 ENCOUNTER — Emergency Department (HOSPITAL_COMMUNITY): Payer: Medicaid Other

## 2019-04-29 NOTE — ED Notes (Signed)
No call for room x1

## 2019-07-17 ENCOUNTER — Encounter (HOSPITAL_COMMUNITY): Payer: Self-pay | Admitting: Emergency Medicine

## 2019-07-17 ENCOUNTER — Emergency Department (HOSPITAL_COMMUNITY)
Admission: EM | Admit: 2019-07-17 | Discharge: 2019-07-17 | Disposition: A | Discharge status: Home / Self Care | Payer: Medicaid Other | Attending: Emergency Medicine | Admitting: Emergency Medicine

## 2019-07-17 ENCOUNTER — Emergency Department (HOSPITAL_COMMUNITY): Payer: Medicaid Other

## 2019-07-17 ENCOUNTER — Other Ambulatory Visit: Payer: Self-pay

## 2019-07-17 DIAGNOSIS — Z20822 Contact with and (suspected) exposure to covid-19: Secondary | ICD-10-CM | POA: Diagnosis not present

## 2019-07-17 DIAGNOSIS — J029 Acute pharyngitis, unspecified: Secondary | ICD-10-CM | POA: Diagnosis not present

## 2019-07-17 DIAGNOSIS — Z79899 Other long term (current) drug therapy: Secondary | ICD-10-CM | POA: Diagnosis not present

## 2019-07-17 DIAGNOSIS — R1310 Dysphagia, unspecified: Secondary | ICD-10-CM | POA: Diagnosis not present

## 2019-07-17 DIAGNOSIS — J069 Acute upper respiratory infection, unspecified: Secondary | ICD-10-CM | POA: Diagnosis not present

## 2019-07-17 LAB — SARS CORONAVIRUS 2 BY RT PCR (HOSPITAL ORDER, PERFORMED IN ~~LOC~~ HOSPITAL LAB): SARS Coronavirus 2: NEGATIVE

## 2019-07-17 LAB — CBC
HCT: 40.6 % (ref 39.0–52.0)
Hemoglobin: 13.7 g/dL (ref 13.0–17.0)
MCH: 29.3 pg (ref 26.0–34.0)
MCHC: 33.7 g/dL (ref 30.0–36.0)
MCV: 86.8 fL (ref 80.0–100.0)
Platelets: 191 10*3/uL (ref 150–400)
RBC: 4.68 MIL/uL (ref 4.22–5.81)
RDW: 12.8 % (ref 11.5–15.5)
WBC: 7.1 10*3/uL (ref 4.0–10.5)
nRBC: 0 % (ref 0.0–0.2)

## 2019-07-17 LAB — BASIC METABOLIC PANEL
Anion gap: 10 (ref 5–15)
BUN: 10 mg/dL (ref 6–20)
CO2: 25 mmol/L (ref 22–32)
Calcium: 9.5 mg/dL (ref 8.9–10.3)
Chloride: 103 mmol/L (ref 98–111)
Creatinine, Ser: 0.65 mg/dL (ref 0.61–1.24)
GFR calc Af Amer: >60 mL/min (ref >60)
GFR calc non Af Amer: >60 mL/min (ref >60)
Glucose, Bld: 105 mg/dL — ABNORMAL HIGH (ref 70–99)
Potassium: 3.6 mmol/L (ref 3.5–5.1)
Sodium: 138 mmol/L (ref 135–145)

## 2019-07-17 LAB — GROUP A STREP BY PCR: Group A Strep by PCR: NOT DETECTED

## 2019-07-17 MED ORDER — OMEPRAZOLE 20 MG PO CPDR
20.0000 mg | DELAYED_RELEASE_CAPSULE | Freq: Every day | ORAL | 0 refills | Status: DC
Start: 2019-07-17 — End: 2021-03-03

## 2019-07-17 NOTE — Discharge Instructions (Addendum)
You were seen here for a scratchy throat, lab and imaging look reassuring.  I prescribed you an acid pill please take as prescribed.  I am also given you information for food choices to help reduce acid reflux please read.  I have given you information for community [facility] and wellness, they work with individuals who have little to no insurance and can help you find a primary care provider.  Please call to schedule appointment at your earliest convenience.  I want to come back to emergency department if you develop difficulty swallowing, shortness of breath, chest pain, uncontrolled nausea, vomiting, diarrhea as the symptoms require further evaluation management.

## 2019-07-17 NOTE — ED Provider Notes (Signed)
[facility] EMERGENCY DEPARTMENT Provider Note   CSN: [id number] Arrival date & time: 07/17/19  1022     History Chief Complaint  Patient presents with  . Dysphagia    Brandon Woods is a 44 y.o. male.  HPI   Patient presents to the emergency department with chief complaint of burning sensation in his throat when he swallows.  Patient explains this started last night after he had pasta, describes the pain as a scratchy, "not smooth sensation" when he tries to swallow.  Patient admitts that this becomes more aggravating when he eats or drinks something and denies alleviating factors.  Patient does admit that he is feeling better today but still feels a little bit of a scratching sensation.  He denies difficulty swallowing, difficulty breathing, or speaking, sick contacts,  fever or chills..  Patient has no significant medical history, does not take any medication on a daily basis.  Patient denies headache, fever, chills, cough, congestion, chest pain, shortness of breath, abdominal pain, dysuria, pedal edema. History reviewed. No pertinent past medical history.  There are no problems to display for this patient.   Past Surgical History:  Procedure Laterality Date  . ANKLE SURGERY Left   . arm surgery Left        Family History  Problem Relation Age of Onset  . Healthy Father     Social History   Tobacco Use  . Smoking status: Never Smoker  . Smokeless tobacco: Never Used  Vaping Use  . Vaping Use: Never used  Substance Use Topics  . Alcohol use: Not Currently  . Drug use: Not Currently    Home Medications Prior to Admission medications   Medication Sig Start Date End Date Taking? Authorizing Provider  cyclobenzaprine (FLEXERIL) 5 MG tablet Take 1 tablet (5 mg total) by mouth 3 (three) times daily as needed for muscle spasms. Patient not taking: Reported on 07/17/2019 11/24/18   Wallis Bamberg, PA-C  naproxen (NAPROSYN) 500 MG tablet Take 1 tablet (500  mg total) by mouth 2 (two) times daily. Patient not taking: Reported on 07/17/2019 11/24/18   Wallis Bamberg, PA-C  omeprazole (PRILOSEC) 20 MG capsule Take 1 capsule (20 mg total) by mouth daily for 21 days. 07/17/19 08/07/19  Carroll Sage, PA-C  dicyclomine (BENTYL) 20 MG tablet Take 1 tablet (20 mg total) by mouth 4 (four) times daily -  before meals and at bedtime. 01/01/18 11/24/18  Shaune Pollack, MD    Allergies    Patient has no known allergies.  Review of Systems   Review of Systems  Constitutional: Negative for chills and fever.  HENT: Positive for sore throat. Negative for congestion, ear pain, facial swelling, postnasal drip, rhinorrhea, trouble swallowing and voice change.   Eyes: Negative for visual disturbance.  Respiratory: Negative for cough and shortness of breath.   Cardiovascular: Negative for chest pain and leg swelling.  Gastrointestinal: Negative for abdominal pain, diarrhea, nausea and vomiting.  Genitourinary: Negative for enuresis and flank pain.  Musculoskeletal: Negative for back pain and myalgias.  Skin: Negative for rash.  Neurological: Negative for dizziness and headaches.  Hematological: Does not bruise/bleed easily.    Physical Exam Updated Vital Signs BP 112/77 (BP Location: Right Arm)   Pulse (!) 57   Temp 98 F (36.7 C) (Oral)   Resp 20   SpO2 100%   Physical Exam Vitals and nursing note reviewed.  Constitutional:      General: He is not in acute distress.  Appearance: He is not ill-appearing.  HENT:     Head: Normocephalic and atraumatic.     Nose: No congestion.     Mouth/Throat:     Mouth: Mucous membranes are moist.     Pharynx: Oropharynx is clear.     Comments: Patient's oral cavity was visualized, uvula was midline, tongue was midline, there is no edema, exudates, erythema noted.  Patient was controlling his own secretions.  There is no abscess, lesions or other gross  abnormalities noted along patient's gumline. Eyes:      General: No scleral icterus. Cardiovascular:     Rate and Rhythm: Normal rate and regular rhythm.     Pulses: Normal pulses.     Heart sounds: No murmur heard.  No friction rub. No gallop.   Pulmonary:     Effort: No respiratory distress.     Breath sounds: No wheezing, rhonchi or rales.  Abdominal:     General: There is no distension.     Tenderness: There is no abdominal tenderness. There is no guarding.  Musculoskeletal:        General: No swelling.  Skin:    General: Skin is warm and dry.     Capillary Refill: Capillary refill takes less than 2 seconds.     Findings: No rash.  Neurological:     Mental Status: He is alert.  Psychiatric:        Mood and Affect: Mood normal.     ED Results / Procedures / Treatments   Labs (all labs ordered are listed, but only abnormal results are displayed) Labs Reviewed  BASIC METABOLIC PANEL - Abnormal; Notable for the following components:      Result Value   Glucose, Bld 105 (*)    All other components within normal limits  SARS CORONAVIRUS 2 BY RT PCR (HOSPITAL ORDER, PERFORMED IN [facility])  GROUP A STREP BY PCR  CBC    EKG None  Radiology DG Chest 2 View  Result Date: 07/17/2019 CLINICAL DATA:  Dysphagia. EXAM: CHEST - 2 VIEW COMPARISON:  Chest x-ray dated April 28, 2019. FINDINGS: The heart size and mediastinal contours are within normal limits. Both lungs are clear. The visualized skeletal structures are unremarkable. IMPRESSION: No active cardiopulmonary disease. Electronically Signed   By: Obie Dredge M.D.   On: 07/17/2019 15:23    Procedures Procedures (including critical care time)  Medications Ordered in ED Medications - No data to display  ED Course  I have reviewed the triage vital signs and the nursing notes.  Pertinent labs & imaging results that were available during my care of the patient were reviewed by me and considered in my medical decision making (see chart for details).    MDM  Rules/Calculators/A&P                          I have personally reviewed all imaging, labs and have interpreted them.  Due to patient complaint most concern for anaphylactic reaction versus strep throat versus esophagus stricture.  Unlikely patient suffering from anaphylactic reaction as patient is nontoxic appearing, vital signs are stable, skin exam did not show rashes, lesions or other gross abnormalities, no signs of airway obstruction, uvula was midline, no exudates or erythema noted, lung sounds were clear bilaterally no wheezing or rhonchi heard.  Unlikely patient suffering from strep throat as patient's rapid strep came back negative, physical exam did not show exudates or erythema in oropharynx.  Unlikely patient suffering esophageal stricture as patient tolerated p.o. without difficulty, denies nausea or vomiting.  CBC does not show leukocytosis or anemia.  BMP does not show electrolyte abnormalities, signs of AKI.  Covid test was negative.  Chest x-ray was performed did not show any acute abnormalities, no edema, infiltrates, consolidation, or widened mediastinum.  Patient admits that he is feeling much better and states that the scratchiness throat has gone away.   Patient appears to be resting comfortably in bed showing in no acute signs distress.  Vital signs have remained stable does not meet criteria to be admitted to the hospital.  Likely patient suffering from GERD and has some inflammation in his oropharynx.  Recommend patient takes an acid pill, gave dietary recommendation and follows up with his primary care doctor for further evaluation management.  Patient discussed with attending who agrees assessment and plan.  Patient was given at home care as well as strict return precautions.  Patient verbalized understood and agrees the plan.  Final Clinical Impression(s) / ED Diagnoses Final diagnoses:  Sore throat    Rx / DC Orders ED Discharge Orders         Ordered    omeprazole  (PRILOSEC) 20 MG capsule  Daily     Discontinue  Reprint     07/17/19 2247           Carroll Sage, PA-C 07/17/19 2317    Melene Plan, DO 07/17/19 2345

## 2019-07-17 NOTE — ED Notes (Signed)
ED Provider at bedside. 

## 2019-07-17 NOTE — ED Triage Notes (Signed)
Pt states after eating some chicken last night he now feels like something is stuck in his throat, is able to drink liquid and eat a muffin but states it just feels uncomfortable when he swallows.

## 2019-09-14 ENCOUNTER — Encounter (HOSPITAL_COMMUNITY): Payer: Self-pay

## 2019-09-14 ENCOUNTER — Emergency Department (HOSPITAL_COMMUNITY): Payer: Medicaid Other

## 2019-09-14 ENCOUNTER — Other Ambulatory Visit: Payer: Self-pay

## 2019-09-14 DIAGNOSIS — R079 Chest pain, unspecified: Secondary | ICD-10-CM | POA: Insufficient documentation

## 2019-09-14 DIAGNOSIS — Z5321 Procedure and treatment not carried out due to patient leaving prior to being seen by health care provider: Secondary | ICD-10-CM | POA: Insufficient documentation

## 2019-09-14 DIAGNOSIS — M25512 Pain in left shoulder: Secondary | ICD-10-CM | POA: Diagnosis not present

## 2019-09-14 NOTE — ED Triage Notes (Signed)
Pt reports L chest pain that started in his L shoulder blade today when he got off of work. Denies injury or any idea where it came from. States that he was walking walking out of work. Denies SOB, nausea, vomiting, or any other symptoms. A&Ox4.

## 2019-09-15 ENCOUNTER — Emergency Department (HOSPITAL_COMMUNITY)
Admission: EM | Admit: 2019-09-15 | Discharge: 2019-09-15 | Disposition: A | Discharge status: Home / Self Care | Payer: Medicaid Other | Attending: Emergency Medicine | Admitting: Emergency Medicine

## 2019-09-15 LAB — CBC
HCT: 41.1 % (ref 39.0–52.0)
Hemoglobin: 14.1 g/dL (ref 13.0–17.0)
MCH: 29.9 pg (ref 26.0–34.0)
MCHC: 34.3 g/dL (ref 30.0–36.0)
MCV: 87.3 fL (ref 80.0–100.0)
Platelets: 220 10*3/uL (ref 150–400)
RBC: 4.71 MIL/uL (ref 4.22–5.81)
RDW: 12.8 % (ref 11.5–15.5)
WBC: 7.7 10*3/uL (ref 4.0–10.5)
nRBC: 0 % (ref 0.0–0.2)

## 2019-09-15 LAB — BASIC METABOLIC PANEL
Anion gap: 11 (ref 5–15)
BUN: 11 mg/dL (ref 6–20)
CO2: 24 mmol/L (ref 22–32)
Calcium: 9.4 mg/dL (ref 8.9–10.3)
Chloride: 106 mmol/L (ref 98–111)
Creatinine, Ser: 0.68 mg/dL (ref 0.61–1.24)
GFR calc Af Amer: >60 mL/min (ref >60)
GFR calc non Af Amer: >60 mL/min (ref >60)
Glucose, Bld: 130 mg/dL — ABNORMAL HIGH (ref 70–99)
Potassium: 3.5 mmol/L (ref 3.5–5.1)
Sodium: 141 mmol/L (ref 135–145)

## 2019-09-15 LAB — TROPONIN I (HIGH SENSITIVITY): Troponin I (High Sensitivity): 4 ng/L (ref <18)

## 2019-09-15 NOTE — ED Notes (Signed)
Pt returned stickers to registration.  

## 2019-09-17 ENCOUNTER — Encounter (HOSPITAL_COMMUNITY): Payer: Self-pay | Admitting: Emergency Medicine

## 2019-12-26 DIAGNOSIS — M542 Cervicalgia: Secondary | ICD-10-CM | POA: Diagnosis not present

## 2019-12-26 DIAGNOSIS — R52 Pain, unspecified: Secondary | ICD-10-CM | POA: Diagnosis not present

## 2019-12-27 ENCOUNTER — Telehealth: Payer: Self-pay

## 2019-12-27 NOTE — Telephone Encounter (Signed)
Transition Care Management Unsuccessful Follow-up Telephone Call  Date of discharge and from where:  12/26/2019 [facility]  Attempts:  1st Attempt  Reason for unsuccessful TCM follow-up call:  Left voice message

## 2019-12-31 NOTE — Telephone Encounter (Signed)
Transition Care Management Unsuccessful Follow-up Telephone Call  Date of discharge and from where:  12/26/2019 [facility]  Attempts:  2nd Attempt  Reason for unsuccessful TCM follow-up call:  Left voice message     

## 2020-01-01 NOTE — Telephone Encounter (Signed)
Transition Care Management Unsuccessful Follow-up Telephone Call  Date of discharge and from where:  12/22/20[facility]   Attempts:  3rd Attempt  Reason for unsuccessful TCM follow-up call:  Left voice message

## 2020-07-06 ENCOUNTER — Encounter (HOSPITAL_COMMUNITY): Payer: Self-pay

## 2020-07-06 ENCOUNTER — Emergency Department (HOSPITAL_COMMUNITY)
Admission: EM | Admit: 2020-07-06 | Discharge: 2020-07-06 | Disposition: A | Discharge status: Home / Self Care | Payer: Medicaid Other | Attending: Emergency Medicine | Admitting: Emergency Medicine

## 2020-07-06 ENCOUNTER — Emergency Department (HOSPITAL_COMMUNITY): Payer: Medicaid Other

## 2020-07-06 ENCOUNTER — Other Ambulatory Visit: Payer: Self-pay

## 2020-07-06 DIAGNOSIS — R0781 Pleurodynia: Secondary | ICD-10-CM | POA: Diagnosis not present

## 2020-07-06 DIAGNOSIS — R0789 Other chest pain: Secondary | ICD-10-CM | POA: Diagnosis not present

## 2020-07-06 NOTE — ED Provider Notes (Signed)
[facility]-EMERGENCY DEPT Provider Note   CSN: [id number] Arrival date & time: 07/06/20  1721     History No chief complaint on file.   Brandon Woods is a 45 y.o. male.  Patient presents chief complaint of left lateral chest pain.  Describes as a sharp pain is felt off and on for about 2 weeks.  Yesterday he was lifting up his arm to put on his shirt when he has sharp exacerbation of the pain.  At rest he states that he does not really feel anything there but when he moves his arm a certain way he feels sharp pain in that area again.  Denies fevers or cough or vomiting or diarrhea.  No fall or trauma that he can recall.      History reviewed. No pertinent past medical history.  There are no problems to display for this patient.   Past Surgical History:  Procedure Laterality Date   ANKLE SURGERY Left    arm surgery Left        Family History  Problem Relation Age of Onset   Healthy Father     Social History   Tobacco Use   Smoking status: Never   Smokeless tobacco: Never  Vaping Use   Vaping Use: Never used  Substance Use Topics   Alcohol use: Not Currently   Drug use: Not Currently    Home Medications Prior to Admission medications   Medication Sig Start Date End Date Taking? Authorizing Provider  cyclobenzaprine (FLEXERIL) 5 MG tablet Take 1 tablet (5 mg total) by mouth 3 (three) times daily as needed for muscle spasms. Patient not taking: Reported on 07/17/2019 11/24/18   Wallis Bamberg, PA-C  naproxen (NAPROSYN) 500 MG tablet Take 1 tablet (500 mg total) by mouth 2 (two) times daily. Patient not taking: Reported on 07/17/2019 11/24/18   Wallis Bamberg, PA-C  omeprazole (PRILOSEC) 20 MG capsule Take 1 capsule (20 mg total) by mouth daily for 21 days. 07/17/19 08/07/19  Carroll Sage, PA-C  dicyclomine (BENTYL) 20 MG tablet Take 1 tablet (20 mg total) by mouth 4 (four) times daily -  before meals and at bedtime. 01/01/18 11/24/18  Shaune Pollack, MD    Allergies    Patient has no known allergies.  Review of Systems   Review of Systems  Constitutional:  Negative for fever.  HENT:  Negative for ear pain and sore throat.   Eyes:  Negative for pain.  Respiratory:  Negative for cough.   Cardiovascular:  Positive for chest pain.  Gastrointestinal:  Negative for abdominal pain.  Genitourinary:  Negative for flank pain.  Musculoskeletal:  Negative for back pain.  Skin:  Negative for color change and rash.  Neurological:  Negative for syncope.  All other systems reviewed and are negative.  Physical Exam Updated Vital Signs BP 121/69   Pulse 69   Temp 98.1 F (36.7 C) (Oral)   Resp 16   Ht 5\' 5"  (1.651 m)   Wt 101.2 kg   SpO2 99%   BMI 37.11 kg/m   Physical Exam Constitutional:      General: He is not in acute distress.    Appearance: He is well-developed.  HENT:     Head: Normocephalic.     Nose: Nose normal.  Eyes:     Extraocular Movements: Extraocular movements intact.  Cardiovascular:     Rate and Rhythm: Normal rate.  Pulmonary:     Effort: Pulmonary effort is normal.  Musculoskeletal:  Comments: No chest wall tenderness or abnormality on palpation.  Skin:    Coloration: Skin is not jaundiced.  Neurological:     Mental Status: He is alert. Mental status is at baseline.    ED Results / Procedures / Treatments   Labs (all labs ordered are listed, but only abnormal results are displayed) Labs Reviewed - No data to display  EKG None  Radiology DG Ribs Unilateral W/Chest Left  Result Date: 07/06/2020 CLINICAL DATA:  Left lateral chest pain. Posterior upper rib pain with pressure. EXAM: LEFT RIBS AND CHEST - 3+ VIEW COMPARISON:  07/17/2019 FINDINGS: No fracture or other bone lesions are seen involving the ribs. Previous plate and screw fixation of the left humerus. There is no evidence of pneumothorax or pleural effusion. Both lungs are clear. Heart size and mediastinal contours are within  normal limits. IMPRESSION: Negative. Electronically Signed   By: Signa Kell M.D.   On: 07/06/2020 18:33    Procedures Procedures   Medications Ordered in ED Medications - No data to display  ED Course  I have reviewed the triage vital signs and the nursing notes.  Pertinent labs & imaging results that were available during my care of the patient were reviewed by me and considered in my medical decision making (see chart for details).    MDM Rules/Calculators/A&P                          X-rays of the chest unremarkable no fracture foreign body or pneumonia noted.  EKG shows sinus rhythm no ST elevations depressions normal rate.  Etiology of his pain unclear suspect muscular etiology.  Advising outpatient follow-up with his doctor within the week.  Advised immediate return for worsening symptoms fevers trouble breathing or any additional concerns.  Final Clinical Impression(s) / ED Diagnoses Final diagnoses:  Chest wall pain    Rx / DC Orders ED Discharge Orders     None        Cheryll Cockayne, MD 07/06/20 1940

## 2020-07-06 NOTE — ED Triage Notes (Signed)
Patient reports that he is having intermittent left shoulder blade area pain that radiates into the left flank area x 2 weeks. Patient states he does not remember any injury or heavy lifting.

## 2020-07-06 NOTE — Discharge Instructions (Addendum)
Your x-rays did not show any fracture or other abnormality.  Your EKG was normal as well.  Follow-up with your doctor within the week.  Take Tylenol and Motrin as needed for pain.  Return if you have fevers difficulty breathing worsening symptoms or any additional concerns.

## 2020-07-08 ENCOUNTER — Telehealth: Payer: Self-pay | Admitting: *Deleted

## 2020-07-08 NOTE — Telephone Encounter (Signed)
Transition Care Management Unsuccessful Follow-up Telephone Call  Date of discharge and from where:  07/06/2020 - [facility]  Attempts:  1st Attempt  Reason for unsuccessful TCM follow-up call:  Left voice message via Spanish interpreter

## 2020-07-09 NOTE — Telephone Encounter (Signed)
Transition Care Management Follow-up Telephone Call Date of discharge and from where: 07/06/2020 - [facility] How have you been since you were released from the hospital? "Doing a little better today" Any questions or concerns? No  Items Reviewed: Did the pt receive and understand the discharge instructions provided? Yes  Medications obtained and verified? Yes  Other? No  Any new allergies since your discharge? No  Dietary orders reviewed? No Do you have support at home? Yes    Functional Questionnaire: (I = Independent and D = Dependent) ADLs: I  Bathing/Dressing- I  Meal Prep- I  Eating- I  Maintaining continence- I  Transferring/Ambulation- I  Managing Meds- I  Follow up appointments reviewed:  PCP Hospital f/u appt confirmed? No   Specialist Hospital f/u appt confirmed? No   Are transportation arrangements needed?  N/A If their condition worsens, is the pt aware to call PCP or go to the Emergency Dept.? Yes Was the patient provided with contact information for the PCP's office or ED? Yes Was to pt encouraged to call back with questions or concerns? Yes

## 2020-09-12 ENCOUNTER — Other Ambulatory Visit: Payer: Self-pay

## 2020-09-12 ENCOUNTER — Emergency Department (HOSPITAL_COMMUNITY)
Admission: EM | Admit: 2020-09-12 | Discharge: 2020-09-12 | Disposition: A | Discharge status: Home / Self Care | Payer: Medicaid Other | Attending: Emergency Medicine | Admitting: Emergency Medicine

## 2020-09-12 DIAGNOSIS — M25511 Pain in right shoulder: Secondary | ICD-10-CM | POA: Diagnosis not present

## 2020-09-12 DIAGNOSIS — Y990 Civilian activity done for income or pay: Secondary | ICD-10-CM | POA: Diagnosis not present

## 2020-09-12 DIAGNOSIS — M542 Cervicalgia: Secondary | ICD-10-CM | POA: Insufficient documentation

## 2020-09-12 DIAGNOSIS — X500XXA Overexertion from strenuous movement or load, initial encounter: Secondary | ICD-10-CM | POA: Diagnosis not present

## 2020-09-12 MED ORDER — CYCLOBENZAPRINE HCL 10 MG PO TABS
10.0000 mg | ORAL_TABLET | Freq: Every evening | ORAL | 0 refills | Status: DC | PRN
Start: 2020-09-12 — End: 2021-03-03

## 2020-09-12 MED ORDER — CYCLOBENZAPRINE HCL 10 MG PO TABS
10.0000 mg | ORAL_TABLET | Freq: Every evening | ORAL | 0 refills | Status: DC | PRN
Start: 2020-09-12 — End: 2020-09-12

## 2020-09-12 NOTE — ED Provider Notes (Signed)
[facility]-EMERGENCY DEPT Provider Note   CSN: [id number] Arrival date & time: 09/12/20  2150     History Chief Complaint  Patient presents with   Shoulder Pain    Brandon Woods is a 45 y.o. male.  HPI Patient is a 45 year old male who presents to the emergency department due to right neck and shoulder pain.  Symptoms started this afternoon while at work.  Patient states that he was carrying a large amount of metal trays on the top of his head.  Gradually thereafter he began experiencing his symptoms.  Pain presents and worsens with movement of his right upper extremity.  Denies any numbness, weakness, headache, visual changes.    No past medical history on file.  There are no problems to display for this patient.   Past Surgical History:  Procedure Laterality Date   ANKLE SURGERY Left    arm surgery Left        Family History  Problem Relation Age of Onset   Healthy Father     Social History   Tobacco Use   Smoking status: Never   Smokeless tobacco: Never  Vaping Use   Vaping Use: Never used  Substance Use Topics   Alcohol use: Not Currently   Drug use: Not Currently    Home Medications Prior to Admission medications   Medication Sig Start Date End Date Taking? Authorizing Provider  cyclobenzaprine (FLEXERIL) 10 MG tablet Take 1 tablet (10 mg total) by mouth at bedtime as needed for muscle spasms. 09/12/20   Placido Sou, PA-C  naproxen (NAPROSYN) 500 MG tablet Take 1 tablet (500 mg total) by mouth 2 (two) times daily. Patient not taking: Reported on 07/17/2019 11/24/18   Wallis Bamberg, PA-C  omeprazole (PRILOSEC) 20 MG capsule Take 1 capsule (20 mg total) by mouth daily for 21 days. 07/17/19 08/07/19  Carroll Sage, PA-C  dicyclomine (BENTYL) 20 MG tablet Take 1 tablet (20 mg total) by mouth 4 (four) times daily -  before meals and at bedtime. 01/01/18 11/24/18  Shaune Pollack, MD    Allergies    Patient has no known  allergies.  Review of Systems   Review of Systems  Eyes:  Negative for visual disturbance.  Musculoskeletal:  Positive for arthralgias, myalgias and neck pain.  Neurological:  Negative for weakness, numbness and headaches.   Physical Exam Updated Vital Signs BP 131/82   Pulse 74   Temp 98.5 F (36.9 C) (Oral)   Resp 16   SpO2 98%   Physical Exam Vitals and nursing note reviewed.  Constitutional:      General: He is not in acute distress.    Appearance: Normal appearance. He is well-developed.  HENT:     Head: Normocephalic and atraumatic.     Right Ear: External ear normal.     Left Ear: External ear normal.  Eyes:     General: No scleral icterus.       Right eye: No discharge.        Left eye: No discharge.     Conjunctiva/sclera: Conjunctivae normal.  Neck:     Vascular: No carotid bruit.     Trachea: No tracheal deviation.     Comments: Mild tenderness appreciated along the right lateral musculature of the neck.  No midline spine pain.  No carotid bruits. Cardiovascular:     Rate and Rhythm: Normal rate.  Pulmonary:     Effort: Pulmonary effort is normal. No respiratory distress.  Breath sounds: No stridor.  Abdominal:     General: There is no distension.  Musculoskeletal:        General: Tenderness present. No swelling or deformity.     Cervical back: Normal range of motion and neck supple. Tenderness present. No rigidity.     Comments: No tenderness appreciated along the musculature of the right superior shoulder, though pain is reproducible with abduction of the right arm overhead.  No bony tenderness noted in the right shoulder.  Skin:    General: Skin is warm and dry.     Findings: No rash.  Neurological:     General: No focal deficit present.     Mental Status: He is alert and oriented to person, place, and time.     Cranial Nerves: Cranial nerve deficit: no gross deficits.     Comments: Strength is 5/5 in the bilateral upper extremities.  Distal  sensation intact.  Grip strength intact.   ED Results / Procedures / Treatments   Labs (all labs ordered are listed, but only abnormal results are displayed) Labs Reviewed - No data to display  EKG None  Radiology No results found.  Procedures Procedures   Medications Ordered in ED Medications - No data to display  ED Course  I have reviewed the triage vital signs and the nursing notes.  Pertinent labs & imaging results that were available during my care of the patient were reviewed by me and considered in my medical decision making (see chart for details).    MDM Rules/Calculators/A&P                          Patient is a 45 year old male who presents to the emergency department due to tenderness along the right lateral neck and right superior shoulder that began gradually this afternoon after carrying metal trays on the top of his head at work.  Physical exam is generally reassuring.  Patient's tenderness is reproducible with abduction of the right upper extremity.  Mild tenderness noted along the right lateral aspect of the neck.  Symptoms appear musculoskeletal in nature.  Strength is 5/5 in the bilateral upper extremities.  No midline spine pain.  No carotid bruits.  No red flags.  Will discharge patient on a course of Flexeril.  We discussed safety regarding this medication.  Denies a history of seizures.  Also recommended Tylenol/ibuprofen for management of his pain.  We discussed dosing.  We discussed return precautions in length.  His questions were answered and he was amicable at the time of discharge.  Final Clinical Impression(s) / ED Diagnoses Final diagnoses:  Neck pain on right side  Acute pain of right shoulder    Rx / DC Orders ED Discharge Orders          Ordered    cyclobenzaprine (FLEXERIL) 10 MG tablet  At bedtime PRN,   Status:  Discontinued        09/12/20 2209    cyclobenzaprine (FLEXERIL) 10 MG tablet  At bedtime PRN        09/12/20 2209              Placido Sou, PA-C 09/12/20 2219    Arby Barrette, MD 09/22/20 [phone]

## 2020-09-12 NOTE — Discharge Instructions (Signed)
I recommend a combination of tylenol and ibuprofen for management of your pain. You can take a low dose of both at the same time. I recommend 500 mg of Tylenol combined with 600 mg of ibuprofen. This is one maximum strength Tylenol and three regular ibuprofen. You can take these 2-3 times for day for your pain. Please try to take these medications with a small amount of food as well to prevent upsetting your stomach.  Also, please consider topical pain relieving creams such as Voltaran Gel, BioFreeze, or Icy Hot. There is also a pain relieving cream made by [employer]. You should be able to find all of these at your local pharmacy.   I am prescribing you a strong muscle relaxer called flexeril. Please only take this medication once in the evening with dinner. This medication can make you quite drowsy. Do not mix it with alcohol. Do not drive a vehicle after taking it.   Please continue to monitor your symptoms closely.  If you develop any new or worsening symptoms such as worsening headaches, visual changes, numbness, weakness, please come back to the emergency department immediately for reevaluation.  It was a pleasure to meet you.

## 2020-09-12 NOTE — ED Triage Notes (Signed)
Pt came in with c/o R sided shoulder and neck pain after lifting a heavy tray over his head at work. Started at 1600

## 2020-09-15 ENCOUNTER — Telehealth: Payer: Self-pay

## 2020-09-15 NOTE — Telephone Encounter (Signed)
Transition Care Management Unsuccessful Follow-up Telephone Call  Date of discharge and from where:  09/12/2020[facility]   Attempts:  1st Attempt  Reason for unsuccessful TCM follow-up call:  Left voice message

## 2020-09-16 NOTE — Telephone Encounter (Signed)
Transition Care Management Follow-up Telephone Call Date of discharge and from where: 09/12/2020 from [facility] How have you been since you were released from the hospital? Pt spouse stated that he is feeling much better today and was able to return to work yesterday. Pt did not have any questions or concerns.  Any questions or concerns? No  Items Reviewed: Did the pt receive and understand the discharge instructions provided? Yes  Medications obtained and verified? Yes  Other? No  Any new allergies since your discharge? No  Dietary orders reviewed? No Do you have support at home? Yes   Functional Questionnaire: (I = Independent and D = Dependent) ADLs: I  Bathing/Dressing- I  Meal Prep- I  Eating- I  Maintaining continence- I  Transferring/Ambulation- I  Managing Meds- I   Follow up appointments reviewed:  PCP Hospital f/u appt confirmed? No   Specialist Hospital f/u appt confirmed? No   Are transportation arrangements needed? No  If their condition worsens, is the pt aware to call PCP or go to the Emergency Dept.? Yes Was the patient provided with contact information for the PCP's office or ED? Yes Was to pt encouraged to call back with questions or concerns? Yes

## 2020-12-16 ENCOUNTER — Emergency Department (HOSPITAL_COMMUNITY)
Admission: EM | Admit: 2020-12-16 | Discharge: 2020-12-17 | Disposition: A | Discharge status: Home / Self Care | Payer: Medicaid Other | Attending: Emergency Medicine | Admitting: Emergency Medicine

## 2020-12-16 ENCOUNTER — Other Ambulatory Visit: Payer: Self-pay

## 2020-12-16 ENCOUNTER — Emergency Department (HOSPITAL_COMMUNITY): Payer: Medicaid Other

## 2020-12-16 ENCOUNTER — Encounter (HOSPITAL_COMMUNITY): Payer: Self-pay | Admitting: Emergency Medicine

## 2020-12-16 DIAGNOSIS — U071 COVID-19: Secondary | ICD-10-CM | POA: Insufficient documentation

## 2020-12-16 DIAGNOSIS — Z28310 Unvaccinated for covid-19: Secondary | ICD-10-CM | POA: Diagnosis not present

## 2020-12-16 DIAGNOSIS — R059 Cough, unspecified: Secondary | ICD-10-CM | POA: Diagnosis not present

## 2020-12-16 LAB — RESP PANEL BY RT-PCR (FLU A&B, COVID) ARPGX2
Influenza A by PCR: NEGATIVE
Influenza B by PCR: NEGATIVE
SARS Coronavirus 2 by RT PCR: POSITIVE — AB

## 2020-12-16 NOTE — ED Provider Notes (Signed)
Emergency Medicine Provider Triage Evaluation Note  Brandon Woods , a 45 y.o. male  was evaluated in triage.  Pt complains of productive cough x3 days.  Endorses sinus congestion.  Denies myalgias, fever.  Denies shortness of breath.   Review of Systems  Positive: As above Negative: As above  Physical Exam  BP (!) 121/91 (BP Location: Right Arm)    Pulse 79    Temp 99 F (37.2 C) (Oral)    Resp 14    SpO2 100%  Gen:   Awake, no distress   Resp:  Normal effort  MSK:   Moves extremities without difficulty  Other:    Medical Decision Making  Medically screening exam initiated at 6:07 PM.  Appropriate orders placed.  Brandon Woods was informed that the remainder of the evaluation will be completed by another provider, this initial triage assessment does not replace that evaluation, and the importance of remaining in the ED until their evaluation is complete.     Marita Kansas, PA-C 12/16/20 1808    Lorre Nick, MD 12/19/20 [phone]

## 2020-12-16 NOTE — ED Triage Notes (Addendum)
C/o productive cough x 3 days. Taking Muccinex without relief. Denies fever, chills, congestion, body aches, and SOB.

## 2020-12-17 ENCOUNTER — Encounter (HOSPITAL_COMMUNITY): Payer: Self-pay | Admitting: Student

## 2020-12-17 MED ORDER — BENZONATATE 100 MG PO CAPS: 100.0000 mg | ORAL_CAPSULE | Freq: Three times a day (TID) | ORAL | 0 refills | Status: DC | PRN

## 2020-12-17 MED ORDER — FLUTICASONE PROPIONATE 50 MCG/ACT NA SUSP
1.0000 | Freq: Every day | NASAL | 0 refills | Status: DC
Start: 2020-12-17 — End: 2021-03-03

## 2020-12-17 NOTE — Discharge Instructions (Addendum)
Your COVID 19 test returned positive which we suspect is the cause of your symptoms:   We are instructing patient's with COVID 19 or symptoms of COVID 19 to follow the below instructions regardless of vaccination status:  Your first day of symptoms is day 0 - Stay home for days 0-5.  - If you have no symptoms or your symptoms are resolving after 5 days, you can leave your house on day 6--> Continue to wear a mask around others for 5 additional days. - If you have a fever, continue to stay home until your fever resolves.  Please use flonase 1 spray per nostril daily as needed for congestion and tessalon (1-2 tablets) every 8 hours as needed for coughing.   We have prescribed you new medication(s) today. Discuss the medications prescribed today with your pharmacist as they can have adverse effects and interactions with your other medicines including over the counter and prescribed medications. Seek medical evaluation if you start to experience new or abnormal symptoms after taking one of these medicines, seek care immediately if you start to experience difficulty breathing, feeling of your throat closing, facial swelling, or rash as these could be indications of a more serious allergic reaction  Please follow up with primary care within 3-5 days for re-evaluation- call prior to going to the office to make them aware of your symptoms as some offices are altering their method of seeing patients with COVID 19 symptoms, we have also provided our [place] covid clinic for follow up as well.  Return to the ER for new or worsening symptoms including but not limited to increased work of breathing, chest pain, passing out, or any other concerns.   Results from todays visit:  Results for orders placed or performed during the hospital encounter of 12/16/20  Resp Panel by RT-PCR (Flu A&B, Covid) Nasopharyngeal Swab   Specimen: Nasopharyngeal Swab; Nasopharyngeal(NP) swabs in vial transport medium  Result Value Ref  Range   SARS Coronavirus 2 by RT PCR POSITIVE (A) NEGATIVE   Influenza A by PCR NEGATIVE NEGATIVE   Influenza B by PCR NEGATIVE NEGATIVE   DG Chest 2 View  Result Date: 12/16/2020 CLINICAL DATA:  Cough EXAM: CHEST - 2 VIEW COMPARISON:  07/16/2020, 04/28/2019 FINDINGS: The heart size and mediastinal contours are within normal limits. Both lungs are clear. The visualized skeletal structures are unremarkable. IMPRESSION: No active cardiopulmonary disease. Electronically Signed   By: Jasmine Pang M.D.   On: 12/16/2020 19:09        Person Under Monitoring Name: Brantly Kalman  Location: [address] [place] [address]   Infection Prevention Recommendations for Individuals Confirmed to have, or Being Evaluated for, 2019 Novel Coronavirus (COVID-19) Infection Who Receive Care at Home  Individuals who are confirmed to have, or are being evaluated for, COVID-19 should follow the prevention steps below until a healthcare provider or local or state health department says they can return to normal activities.  Stay home except to get medical care You should restrict activities outside your home, except for getting medical care. Do not go to work, school, or public areas, and do not use public transportation or taxis.  Call ahead before visiting your doctor Before your medical appointment, call the healthcare provider and tell them that you have, or are being evaluated for, COVID-19 infection. This will help the healthcare providers office take steps to keep other people from getting infected. Ask your healthcare provider to call the local or state health  department.  Monitor your symptoms Seek prompt medical attention if your illness is worsening (e.g., difficulty breathing). Before going to your medical appointment, call the healthcare provider and tell them that you have, or are being evaluated for, COVID-19 infection. Ask your healthcare provider to call the local or state  health department.  Wear a facemask You should wear a facemask that covers your nose and mouth when you are in the same room with other people and when you visit a healthcare provider. People who live with or visit you should also wear a facemask while they are in the same room with you.  Separate yourself from other people in your home As much as possible, you should stay in a different room from other people in your home. Also, you should use a separate bathroom, if available.  Avoid sharing household items You should not share dishes, drinking glasses, cups, eating utensils, towels, bedding, or other items with other people in your home. After using these items, you should wash them thoroughly with soap and water.  Cover your coughs and sneezes Cover your mouth and nose with a tissue when you cough or sneeze, or you can cough or sneeze into your sleeve. Throw used tissues in a lined trash can, and immediately wash your hands with soap and water for at least 20 seconds or use an alcohol-based hand rub.  Wash your [employer] your hands often and thoroughly with soap and water for at least 20 seconds. You can use an alcohol-based hand sanitizer if soap and water are not available and if your hands are not visibly dirty. Avoid touching your eyes, nose, and mouth with unwashed hands.   Prevention Steps for Caregivers and Household Members of Individuals Confirmed to have, or Being Evaluated for, COVID-19 Infection Being Cared for in the Home  If you live with, or provide care at home for, a person confirmed to have, or being evaluated for, COVID-19 infection please follow these guidelines to prevent infection:  Follow healthcare providers instructions Make sure that you understand and can help the patient follow any healthcare provider instructions for all care.  Provide for the patients basic needs You should help the patient with basic needs in the home and provide support for  getting groceries, prescriptions, and other personal needs.  Monitor the patients symptoms If they are getting sicker, call his or her medical provider and tell them that the patient has, or is being evaluated for, COVID-19 infection. This will help the healthcare providers office take steps to keep other people from getting infected. Ask the healthcare provider to call the local or state health department.  Limit the number of people who have contact with the patient If possible, have only one caregiver for the patient. Other household members should stay in another home or place of residence. If this is not possible, they should stay in another room, or be separated from the patient as much as possible. Use a separate bathroom, if available. Restrict visitors who do not have an essential need to be in the home.  Keep older adults, very young children, and other sick people away from the patient Keep older adults, very young children, and those who have compromised immune systems or chronic health conditions away from the patient. This includes people with chronic heart, lung, or kidney conditions, diabetes, and cancer.  Ensure good ventilation Make sure that shared spaces in the home have good air flow, such as from an air conditioner or an  opened window, weather permitting.  Wash your hands often Wash your hands often and thoroughly with soap and water for at least 20 seconds. You can use an alcohol based hand sanitizer if soap and water are not available and if your hands are not visibly dirty. Avoid touching your eyes, nose, and mouth with unwashed hands. Use disposable paper towels to dry your hands. If not available, use dedicated cloth towels and replace them when they become wet.  Wear a facemask and gloves Wear a disposable facemask at all times in the room and gloves when you touch or have contact with the patients blood, body fluids, and/or secretions or excretions, such as  sweat, saliva, sputum, nasal mucus, vomit, urine, or feces.  Ensure the mask fits over your nose and mouth tightly, and do not touch it during use. Throw out disposable facemasks and gloves after using them. Do not reuse. Wash your hands immediately after removing your facemask and gloves. If your personal clothing becomes contaminated, carefully remove clothing and launder. Wash your hands after handling contaminated clothing. Place all used disposable facemasks, gloves, and other waste in a lined container before disposing them with other household waste. Remove gloves and wash your hands immediately after handling these items.  Do not share dishes, glasses, or other household items with the patient Avoid sharing household items. You should not share dishes, drinking glasses, cups, eating utensils, towels, bedding, or other items with a patient who is confirmed to have, or being evaluated for, COVID-19 infection. After the person uses these items, you should wash them thoroughly with soap and water.  Wash laundry thoroughly Immediately remove and wash clothes or bedding that have blood, body fluids, and/or secretions or excretions, such as sweat, saliva, sputum, nasal mucus, vomit, urine, or feces, on them. Wear gloves when handling laundry from the patient. Read and follow directions on labels of laundry or clothing items and detergent. In general, wash and dry with the warmest temperatures recommended on the label.  Clean all areas the individual has used often Clean all touchable surfaces, such as counters, tabletops, doorknobs, bathroom fixtures, toilets, phones, keyboards, tablets, and bedside tables, every day. Also, clean any surfaces that may have blood, body fluids, and/or secretions or excretions on them. Wear gloves when cleaning surfaces the patient has come in contact with. Use a diluted bleach solution (e.g., dilute bleach with 1 part bleach and 10 parts water) or a household  disinfectant with a label that says EPA-registered for coronaviruses. To make a bleach solution at home, add 1 tablespoon of bleach to 1 quart (4 cups) of water. For a larger supply, add  cup of bleach to 1 gallon (16 cups) of water. Read labels of cleaning products and follow recommendations provided on product labels. Labels contain instructions for safe and effective use of the cleaning product including precautions you should take when applying the product, such as wearing gloves or eye protection and making sure you have good ventilation during use of the product. Remove gloves and wash hands immediately after cleaning.  Monitor yourself for signs and symptoms of illness Caregivers and household members are considered close contacts, should monitor their health, and will be asked to limit movement outside of the home to the extent possible. Follow the monitoring steps for close contacts listed on the symptom monitoring form.   ? If you have additional questions, contact your local health department or call the epidemiologist on call at [phone] (available 24/7). ? This guidance is subject  to change. For the most up-to-date guidance from [facility], please refer to their website: [email or web address]

## 2020-12-17 NOTE — ED Provider Notes (Signed)
[facility] EMERGENCY DEPARTMENT Provider Note   CSN: [id number] Arrival date & time: 12/16/20  1602     History Chief Complaint  Patient presents with   Cough    Brandon Woods is a 45 y.o. male without significant pmhx who presents to the ED with complaints of cough x 3 days. Patient reports nasal congestion, post nasal drip, and productive cough with mucus sputum. No alleviating/aggravating factors. Multiple sick contacts @ work. Denies fever, dyspnea, chest pain, abdominal pain, N/V/D, or syncope, Has not had covid vaccine.   Offered translator, patient declined.    HPI     History reviewed. No pertinent past medical history.  There are no problems to display for this patient.   Past Surgical History:  Procedure Laterality Date   ANKLE SURGERY Left    arm surgery Left        Family History  Problem Relation Age of Onset   Healthy Father     Social History   Tobacco Use   Smoking status: Never   Smokeless tobacco: Never  Vaping Use   Vaping Use: Never used  Substance Use Topics   Alcohol use: Not Currently   Drug use: Not Currently    Home Medications Prior to Admission medications   Medication Sig Start Date End Date Taking? Authorizing Provider  cyclobenzaprine (FLEXERIL) 10 MG tablet Take 1 tablet (10 mg total) by mouth at bedtime as needed for muscle spasms. 09/12/20   Placido Sou, PA-C  naproxen (NAPROSYN) 500 MG tablet Take 1 tablet (500 mg total) by mouth 2 (two) times daily. Patient not taking: Reported on 07/17/2019 11/24/18   Wallis Bamberg, PA-C  omeprazole (PRILOSEC) 20 MG capsule Take 1 capsule (20 mg total) by mouth daily for 21 days. 07/17/19 08/07/19  Carroll Sage, PA-C  dicyclomine (BENTYL) 20 MG tablet Take 1 tablet (20 mg total) by mouth 4 (four) times daily -  before meals and at bedtime. 01/01/18 11/24/18  Shaune Pollack, MD    Allergies    Patient has no known allergies.  Review of Systems   Review of  Systems  Constitutional:  Negative for chills and fever.  HENT:  Positive for congestion and postnasal drip. Negative for ear pain and sore throat.   Respiratory:  Positive for cough. Negative for shortness of breath.   Cardiovascular:  Negative for chest pain and leg swelling.  Gastrointestinal:  Negative for abdominal pain, diarrhea, nausea and vomiting.  Neurological:  Negative for syncope.  All other systems reviewed and are negative.  Physical Exam Updated Vital Signs BP 116/76 (BP Location: Right Arm)    Pulse 69    Temp 99 F (37.2 C) (Oral)    Resp (!) 23    SpO2 98%   Physical Exam Vitals and nursing note reviewed.  Constitutional:      General: He is not in acute distress.    Appearance: He is well-developed. He is not toxic-appearing.  HENT:     Head: Normocephalic and atraumatic.     Right Ear: Ear canal normal. Tympanic membrane is not perforated, erythematous, retracted or bulging.     Left Ear: Ear canal normal. Tympanic membrane is not perforated, erythematous, retracted or bulging.     Ears:     Comments: No mastoid erythema/swellng/tenderness.     Nose: Congestion present.     Right Sinus: No maxillary sinus tenderness or frontal sinus tenderness.     Left Sinus: No maxillary sinus tenderness or frontal sinus  tenderness.     Mouth/Throat:     Pharynx: Oropharynx is clear. Uvula midline. No oropharyngeal exudate or posterior oropharyngeal erythema.     Comments: Posterior oropharynx is symmetric appearing. Patient tolerating own secretions without difficulty. No trismus. No drooling. No hot potato voice. No swelling beneath the tongue, submandibular compartment is soft.  Eyes:     Pupils: Pupils are equal, round, and reactive to light.  Cardiovascular:     Rate and Rhythm: Normal rate and regular rhythm.  Pulmonary:     Effort: Pulmonary effort is normal. No respiratory distress.     Breath sounds: Normal breath sounds. No wheezing, rhonchi or rales.  Abdominal:      General: There is no distension.     Palpations: Abdomen is soft.     Tenderness: There is no abdominal tenderness.  Musculoskeletal:     Cervical back: Neck supple. No rigidity.  Lymphadenopathy:     Cervical: No cervical adenopathy.  Skin:    General: Skin is warm and dry.     Findings: No rash.  Neurological:     Mental Status: He is alert.  Psychiatric:        Behavior: Behavior normal.    ED Results / Procedures / Treatments   Labs (all labs ordered are listed, but only abnormal results are displayed) Labs Reviewed  RESP PANEL BY RT-PCR (FLU A&B, COVID) ARPGX2 - Abnormal; Notable for the following components:      Result Value   SARS Coronavirus 2 by RT PCR POSITIVE (*)    All other components within normal limits    EKG None  Radiology DG Chest 2 View  Result Date: 12/16/2020 CLINICAL DATA:  Cough EXAM: CHEST - 2 VIEW COMPARISON:  07/16/2020, 04/28/2019 FINDINGS: The heart size and mediastinal contours are within normal limits. Both lungs are clear. The visualized skeletal structures are unremarkable. IMPRESSION: No active cardiopulmonary disease. Electronically Signed   By: Jasmine Pang M.D.   On: 12/16/2020 19:09    Procedures Procedures   Medications Ordered in ED Medications - No data to display  ED Course  I have reviewed the triage vital signs and the nursing notes.  Pertinent labs & imaging results that were available during my care of the patient were reviewed by me and considered in my medical decision making (see chart for details).  Brandon Woods was evaluated in Emergency Department on 12/16/20 for the symptoms described in the history of present illness. He/she was evaluated in the context of the global COVID-19 pandemic, which necessitated consideration that the patient might be at risk for infection with the SARS-CoV-2 virus that causes COVID-19. Institutional protocols and algorithms that pertain to the evaluation of patients at risk for  COVID-19 are in a state of rapid change based on information released by regulatory bodies including the CDC and federal and state organizations. These policies and algorithms were followed during the patient's care in the ED.    MDM Rules/Calculators/A&P                           Patient presents to the ED with complaints of congestion & cough.  Patient is nontoxic, in no acute distress, vitals fairly unremarkable, does not appear tachypneic on my exam.   Additional history obtained:  Additional history obtained from chart review & nursing note review.   Lab Tests:  I reviewed and interpreted labs, which included:  Influenza: Negative COVID 19: Positive.  Imaging Studies ordered:  CXR ordered in triage, I independently visualized and interpreted imaging which showed No active cardiopulmonary disease  ED Course:  Exam is without signs of AOM, AOE, or mastoiditis. Oropharyngeal exam is benign. No sinus tenderness. No meningeal signs. Lungs are CTA without focal adventitious sounds, no signs of increased work of breathing, CXR without infiltrate, doubt CAP. Covid 19 positive- likely caus of sxs, patient denies pmhx, has not received vaccine though, discussed risk/benefits of antiviral therapy- patient declined, agreeable to supportive care.I discussed results, treatment plan- including need for isolation, need for follow-up, and return precautions with the patient. Provided opportunity for questions, patient confirmed understanding and is in agreement with plan.   Portions of this note were generated with Scientist, clinical (histocompatibility and immunogenetics). Dictation errors may occur despite best attempts at proofreading.  Final Clinical Impression(s) / ED Diagnoses Final diagnoses:  COVID-19    Rx / DC Orders ED Discharge Orders          Ordered    fluticasone (FLONASE) 50 MCG/ACT nasal spray  Daily        12/17/20 0017    benzonatate (TESSALON PERLES) 100 MG capsule  3 times daily PRN        12/17/20  0017             Kody Brandl, Pleas Koch, PA-C 12/17/20 0031    Geoffery Lyons, MD 12/17/20 [phone]

## 2020-12-18 ENCOUNTER — Telehealth: Payer: Self-pay

## 2020-12-18 NOTE — Telephone Encounter (Signed)
Transition Care Management Unsuccessful Follow-up Telephone Call ° °Date of discharge and from where:  12/17/2020 from [place] ° °Attempts:  1st Attempt ° °Reason for unsuccessful TCM follow-up call:  Left voice message ° ° ° °

## 2020-12-19 NOTE — Telephone Encounter (Signed)
Transition Care Management Unsuccessful Follow-up Telephone Call ° °Date of discharge and from where:  12/17/2020 from [place] ° °Attempts:  2nd Attempt ° °Reason for unsuccessful TCM follow-up call:  Left voice message ° ° ° °

## 2020-12-22 NOTE — Telephone Encounter (Signed)
Transition Care Management Unsuccessful Follow-up Telephone Call ° °Date of discharge and from where:  12/17/2020 from [place] ° °Attempts:  3rd Attempt ° °Reason for unsuccessful TCM follow-up call:  Unable to reach patient ° ° ° °

## 2021-03-03 ENCOUNTER — Encounter (HOSPITAL_COMMUNITY): Payer: Self-pay

## 2021-03-03 ENCOUNTER — Other Ambulatory Visit: Payer: Self-pay

## 2021-03-03 ENCOUNTER — Ambulatory Visit (HOSPITAL_COMMUNITY)
Admission: EM | Admit: 2021-03-03 | Discharge: 2021-03-03 | Disposition: A | Discharge status: Home / Self Care | Payer: Medicaid Other | Attending: Family Medicine | Admitting: Family Medicine

## 2021-03-03 DIAGNOSIS — H1032 Unspecified acute conjunctivitis, left eye: Secondary | ICD-10-CM | POA: Diagnosis not present

## 2021-03-03 DIAGNOSIS — L03213 Periorbital cellulitis: Secondary | ICD-10-CM | POA: Diagnosis not present

## 2021-03-03 MED ORDER — AMOXICILLIN-POT CLAVULANATE 875-125 MG PO TABS: 1.0000 | ORAL_TABLET | Freq: Two times a day (BID) | ORAL | 0 refills | Status: AC

## 2021-03-03 MED ORDER — GENTAMICIN SULFATE 0.3 % OP SOLN: 2.0000 [drp] | Freq: Three times a day (TID) | OPHTHALMIC | 0 refills | Status: AC

## 2021-03-03 MED ORDER — FLUORESCEIN SODIUM 1 MG OP STRP
ORAL_STRIP | OPHTHALMIC | Status: AC
  Filled 2021-03-03: qty 1

## 2021-03-03 NOTE — ED Provider Notes (Signed)
[facility]    CSN: [id number] Arrival date & time: 03/03/21  1218      History   Chief Complaint Chief Complaint  Patient presents with   Eye Problem    HPI Brandon Woods is a 46 y.o. male.    Eye Problem Here with sudden onset of foreign body sensation in left eye irritation that began yesterday afternoon when he was at work.  He actually does not recall any time where he might of had a foreign body fly into his eye.  He has had discharge, and redness of the eye and of the eyelids of that eye since then.    History reviewed. No pertinent past medical history.  There are no problems to display for this patient.   Past Surgical History:  Procedure Laterality Date   ANKLE SURGERY Left    arm surgery Left        Home Medications    Prior to Admission medications   Medication Sig Start Date End Date Taking? Authorizing Provider  amoxicillin-clavulanate (AUGMENTIN) 875-125 MG tablet Take 1 tablet by mouth 2 (two) times daily for 7 days. 03/03/21 03/10/21 Yes Koy Lamp, Janace Aris, MD  gentamicin (GARAMYCIN) 0.3 % ophthalmic solution Place 2 drops into the left eye 3 (three) times daily for 5 days. 03/03/21 03/08/21 Yes Faatimah Spielberg, Janace Aris, MD  dicyclomine (BENTYL) 20 MG tablet Take 1 tablet (20 mg total) by mouth 4 (four) times daily -  before meals and at bedtime. 01/01/18 11/24/18  Shaune Pollack, MD    Family History Family History  Problem Relation Age of Onset   Healthy Father     Social History Social History   Tobacco Use   Smoking status: Never   Smokeless tobacco: Never  Vaping Use   Vaping Use: Never used  Substance Use Topics   Alcohol use: Not Currently   Drug use: Not Currently     Allergies   Patient has no known allergies.   Review of Systems Review of Systems   Physical Exam Triage Vital Signs ED Triage Vitals  Enc Vitals Group     BP 03/03/21 1342 129/75     Pulse Rate 03/03/21 1342 64     Resp 03/03/21 1342 20     Temp  03/03/21 1342 98.4 F (36.9 C)     Temp Source 03/03/21 1342 Oral     SpO2 03/03/21 1342 99 %     Weight --      Height --      Head Circumference --      Peak Flow --      Pain Score 03/03/21 1348 5     Pain Loc --      Pain Edu? --      Excl. in GC? --    No data found.  Updated Vital Signs BP 129/75 (BP Location: Left Arm)    Pulse 64    Temp 98.4 F (36.9 C) (Oral)    Resp 20    SpO2 99%   Visual Acuity Right Eye Distance:   Left Eye Distance:   Bilateral Distance:    Right Eye Near:   Left Eye Near:    Bilateral Near:     Physical Exam Vitals reviewed.  Constitutional:      General: He is not in acute distress.    Appearance: He is not toxic-appearing.  Eyes:     Extraocular Movements: Extraocular movements intact.     Pupils: Pupils are equal, round,  and reactive to light.     Comments: There is injection of the left eye.  The upper and lower eyelids are mildly erythematous and a little swollen also.  There is no area of fluctuance or pointing.  There is some eye discharge on his eyelashes.  Tetracaine eyedrops are administered and fluorescein stain is done.  There is no uptake or abrasion noted with UV light.  Also I cannot discern any foreign body.  Skin:    Coloration: Skin is not pale.  Neurological:     Mental Status: He is alert and oriented to person, place, and time.  Psychiatric:        Behavior: Behavior normal.     UC Treatments / Results  Labs (all labs ordered are listed, but only abnormal results are displayed) Labs Reviewed - No data to display  EKG   Radiology No results found.  Procedures Procedures (including critical care time)  Medications Ordered in UC Medications - No data to display  Initial Impression / Assessment and Plan / UC Course  I have reviewed the triage vital signs and the nursing notes.  Pertinent labs & imaging results that were available during my care of the patient were reviewed by me and considered in my  medical decision making (see chart for details).     With eyelid swelling we will also treat with some oral antibiotics. Final Clinical Impressions(s) / UC Diagnoses   Final diagnoses:  Acute conjunctivitis of left eye, unspecified acute conjunctivitis type  Preseptal cellulitis of left eye     Discharge Instructions      Take amoxicillin-clavulanate 875 mg, 1 tab twice daily with food for 7 days   Put gentamicin eyedrops in your left eye 3 times daily for 5 days.  Cool compresses can be soothing and help how it feels     ED Prescriptions     Medication Sig Dispense Auth. Provider   amoxicillin-clavulanate (AUGMENTIN) 875-125 MG tablet Take 1 tablet by mouth 2 (two) times daily for 7 days. 14 tablet Gael Delude, Janace Aris, MD   gentamicin (GARAMYCIN) 0.3 % ophthalmic solution Place 2 drops into the left eye 3 (three) times daily for 5 days. 5 mL Zenia Resides, MD      PDMP not reviewed this encounter.   Zenia Resides, MD 03/03/21 [phone]

## 2021-03-03 NOTE — ED Triage Notes (Signed)
Pt presents with left eye swelling & drainage that he believes may be from a foreign body from work yesterday.

## 2021-03-03 NOTE — Discharge Instructions (Addendum)
Take amoxicillin-clavulanate 875 mg, 1 tab twice daily with food for 7 days   Put gentamicin eyedrops in your left eye 3 times daily for 5 days.  Cool compresses can be soothing and help how it feels

## 2021-03-11 ENCOUNTER — Ambulatory Visit (HOSPITAL_COMMUNITY)
Admission: EM | Admit: 2021-03-11 | Discharge: 2021-03-11 | Disposition: A | Discharge status: Home / Self Care | Payer: Medicaid Other | Attending: Family Medicine | Admitting: Family Medicine

## 2021-03-11 ENCOUNTER — Other Ambulatory Visit: Payer: Self-pay

## 2021-03-11 ENCOUNTER — Encounter (HOSPITAL_COMMUNITY): Payer: Self-pay | Admitting: Emergency Medicine

## 2021-03-11 DIAGNOSIS — R1110 Vomiting, unspecified: Secondary | ICD-10-CM | POA: Diagnosis not present

## 2021-03-11 MED ORDER — ONDANSETRON 4 MG PO TBDP: 4.0000 mg | ORAL_TABLET | Freq: Three times a day (TID) | ORAL | 0 refills | Status: AC | PRN

## 2021-03-11 NOTE — ED Provider Notes (Signed)
?  [facility] ? ? ?[id number] ?03/11/21 Arrival Time: [phone] ? ?ASSESSMENT & PLAN: ? ?1. Vomiting, unspecified vomiting type, unspecified whether nausea present   ? ?Feeling better. Work note provided. ?To use if needed: ?Meds ordered this encounter  ?Medications  ? ondansetron (ZOFRAN-ODT) 4 MG disintegrating tablet  ?  Sig: Take 1 tablet (4 mg total) by mouth every 8 (eight) hours as needed for nausea or vomiting.  ?  Dispense:  15 tablet  ?  Refill:  0  ? ?Will do his best to ensure adequate fluid intake in order to avoid dehydration. ?Will proceed to the Emergency Department for evaluation if unable to tolerate PO fluids regularly. ? ?Otherwise he will f/u with his PCP or here if not showing improvement over the next 48-72 hours. ? ?Reviewed expectations re: course of current medical issues. Questions answered. ?Outlined signs and symptoms indicating need for more acute intervention. ?Patient verbalized understanding. ?After Visit Summary given. ? ? ?SUBJECTIVE: ?History from: patient. ? ?Brandon Woods is a 46 y.o. male who presents with complaint of non-bilious, non-bloody n/v; x 1 this morning; feeling better now. No diarrhea. Afebrile. No abd pain. Needs work note. ?No tx PTA. ? ?Past Surgical History:  ?Procedure Laterality Date  ? ANKLE SURGERY Left   ? arm surgery Left   ? ?OBJECTIVE: ? ?Vitals:  ? 03/11/21 1013  ?BP: 119/80  ?Pulse: 66  ?Resp: 18  ?Temp: 97.6 ?F (36.4 ?C)  ?TempSrc: Oral  ?SpO2: 97%  ?  ?General appearance: alert; no distress ?Oropharynx: moist ?Lungs: unlabored ?Heart: regular ?Abdomen: soft; non-distended; no significant abdominal tenderness; no guarding or rebound tenderness ?Back: no CVA tenderness ?Extremities: no edema; symmetrical with no gross deformities ?Skin: warm; dry ?Neurologic: normal gait ?Psychological: alert and cooperative; normal mood and affect ? ? ?No Known Allergies ?                                            ?History reviewed. No pertinent past medical  history. ?Social History  ? ?Socioeconomic History  ? Marital status: Married  ?  Spouse name: Not on file  ? Number of children: Not on file  ? Years of education: Not on file  ? Highest education level: Not on file  ?Occupational History  ? Not on file  ?Tobacco Use  ? Smoking status: Never  ? Smokeless tobacco: Never  ?Vaping Use  ? Vaping Use: Never used  ?Substance and Sexual Activity  ? Alcohol use: Not Currently  ? Drug use: Not Currently  ? Sexual activity: Not on file  ?Other Topics Concern  ? Not on file  ?Social History Narrative  ? ** Merged History Encounter **  ?    ? ?Social Determinants of Health  ? ?Financial Resource Strain: Not on file  ?Food Insecurity: Not on file  ?Transportation Needs: Not on file  ?Physical Activity: Not on file  ?Stress: Not on file  ?Social Connections: Not on file  ?Intimate Partner Violence: Not on file  ? ?Family History  ?Problem Relation Age of Onset  ? Healthy Mother   ? ? ?  ?Mardella Layman, MD ?03/11/21 1032 ? ?

## 2021-03-11 NOTE — Discharge Instructions (Signed)
Please do your best to ensure adequate fluid intake in order to avoid dehydration. If you find that you are unable to tolerate drinking fluids regularly please proceed to the Emergency Department for evaluation. ° ° °

## 2021-03-11 NOTE — ED Triage Notes (Signed)
Patient has vomited 2 times this morning.  Denies dizziness, no headache.  Denies pain.   ?

## 2021-05-14 ENCOUNTER — Encounter (HOSPITAL_BASED_OUTPATIENT_CLINIC_OR_DEPARTMENT_OTHER): Payer: Self-pay

## 2021-05-14 ENCOUNTER — Other Ambulatory Visit: Payer: Self-pay

## 2021-05-14 ENCOUNTER — Emergency Department (HOSPITAL_BASED_OUTPATIENT_CLINIC_OR_DEPARTMENT_OTHER)
Admission: EM | Admit: 2021-05-14 | Discharge: 2021-05-14 | Disposition: A | Discharge status: Home / Self Care | Payer: Medicaid Other | Attending: Emergency Medicine | Admitting: Emergency Medicine

## 2021-05-14 DIAGNOSIS — M545 Low back pain, unspecified: Secondary | ICD-10-CM | POA: Diagnosis not present

## 2021-05-14 NOTE — Discharge Instructions (Signed)
Recommend 800 mg ibuprofen every 8 hours as needed for pain.  Recommend 1000 mg of Tylenol every 6 hours as needed for pain.  Overall suspect you had a brief muscle spasm. ?

## 2021-05-14 NOTE — ED Notes (Signed)
Pt provided discharge instructions and prescription information. Pt was given the opportunity to ask questions and questions were answered.   

## 2021-05-14 NOTE — ED Triage Notes (Signed)
Pt reports he had L lumbar pain that lasted 5 minutes. Pt was doing dishes at the time. Pt denies any pain or symptoms during triage. Denies urinary symptoms.  ?

## 2021-05-14 NOTE — ED Provider Notes (Signed)
?[facility] EMERGENCY DEPT ?Provider Note ? ? ?CSN: [id number] ?Arrival date & time: 05/14/21  1434 ? ?  ? ?History ? ?Chief Complaint  ?Patient presents with  ? Back Pain  ? ? ?Brandon Woods is a 46 y.o. male. ? ?Here with back pain.  No significant medical history.  Had left lower back pain for a couple minutes at work.  Was doing manual labor.  Denies any loss of bowel or bladder.  Did not fall.  No trauma.  Pain has now resolved.  Nothing has made it worse or better. ? ? ?Back Pain ? ?  ? ?Home Medications ?Prior to Admission medications   ?Medication Sig Start Date End Date Taking? Authorizing Provider  ?NON FORMULARY Eye drop for pink eye ?And a pill for his eye    [provider]  ?ondansetron (ZOFRAN-ODT) 4 MG disintegrating tablet Take 1 tablet (4 mg total) by mouth every 8 (eight) hours as needed for nausea or vomiting. 03/11/21   Vanessa Kick, MD  ?dicyclomine (BENTYL) 20 MG tablet Take 1 tablet (20 mg total) by mouth 4 (four) times daily -  before meals and at bedtime. 01/01/18 11/24/18  Duffy Bruce, MD  ?   ? ?Allergies    ?Patient has no known allergies.   ? ?Review of Systems   ?Review of Systems  ?Musculoskeletal:  Positive for back pain.  ? ?Physical Exam ?Updated Vital Signs ?BP 140/83 (BP Location: Right Arm)   Pulse 85   Temp 98.4 ?F (36.9 ?C) (Oral)   Resp 20   Ht 5\' 5"  (1.651 m)   Wt 104.3 kg   SpO2 99%   BMI 38.27 kg/m?  ?Physical Exam ?Vitals and nursing note reviewed.  ?Constitutional:   ?   General: He is not in acute distress. ?   Appearance: He is well-developed.  ?HENT:  ?   Head: Normocephalic and atraumatic.  ?Eyes:  ?   Conjunctiva/sclera: Conjunctivae normal.  ?Cardiovascular:  ?   Rate and Rhythm: Normal rate and regular rhythm.  ?   Heart sounds: No murmur heard. ?Pulmonary:  ?   Effort: Pulmonary effort is normal. No respiratory distress.  ?   Breath sounds: Normal breath sounds.  ?Abdominal:  ?   Palpations: Abdomen is soft.  ?   Tenderness: There  is no abdominal tenderness.  ?Musculoskeletal:     ?   General: No swelling or tenderness. Normal range of motion.  ?   Cervical back: Neck supple.  ?Skin: ?   General: Skin is warm and dry.  ?   Capillary Refill: Capillary refill takes less than 2 seconds.  ?Neurological:  ?   General: No focal deficit present.  ?   Mental Status: He is alert.  ?   Sensory: No sensory deficit.  ?   Motor: No weakness.  ?Psychiatric:     ?   Mood and Affect: Mood normal.  ? ? ?ED Results / Procedures / Treatments   ?Labs ?(all labs ordered are listed, but only abnormal results are displayed) ?Labs Reviewed - No data to display ? ?EKG ?None ? ?Radiology ?No results found. ? ?Procedures ?Procedures  ? ? ?Medications Ordered in ED ?Medications - No data to display ? ?ED Course/ Medical Decision Making/ A&P ?  ?                        ?Medical Decision Making ? ?Brandon Woods is here with back pain that has  now resolved.  Normal vitals.  No fever.  Had spasm in his left lower back lasted several minutes.  Was sent here by his work.  Works a Retail buyer job.  Normal vitals.  No fever.  Denies any weakness or numbness.  No saddle anesthesia.  No urinary retention.  I have no concern for kidney stone or cauda equina or any other acute process.  Sounds like he had a brief muscle spasm that is now resolved.  He has good pulses in his lower extremities.  I have no concern for arterial process.  Denies any abdominal pain or other issues including constipation or diarrhea.  Recommend Tylenol and ibuprofen as needed.  Overall suspect muscle spasm.  Discharged in good condition. ? ?This chart was dictated using voice recognition software.  Despite best efforts to proofread,  errors can occur which can change the documentation meaning.  ? ? ? ? ? ? ? ?Final Clinical Impression(s) / ED Diagnoses ?Final diagnoses:  ?Acute left-sided low back pain without sciatica  ? ? ?Rx / DC Orders ?ED Discharge Orders   ? ? None  ? ?  ? ? ?  ?Lennice Sites,  DO ?05/14/21 1507 ? ?

## 2021-05-15 ENCOUNTER — Telehealth: Payer: Self-pay

## 2021-05-15 NOTE — Telephone Encounter (Signed)
Transition Care Management Unsuccessful Follow-up Telephone Call ? ?Date of discharge and from where:  05/14/2021 from [facility] ? ?Attempts:  1st Attempt ? ?Reason for unsuccessful TCM follow-up call:  Left voice message ? ? ? ?

## 2021-05-19 NOTE — Telephone Encounter (Signed)
Transition Care Management Unsuccessful Follow-up Telephone Call ? ?Date of discharge and from where:  05/14/2021 from [facility] ? ?Attempts:  2nd Attempt ? ?Reason for unsuccessful TCM follow-up call:  Left voice message ? ? ? ?

## 2021-05-20 NOTE — Telephone Encounter (Signed)
Transition Care Management Unsuccessful Follow-up Telephone Call ? ?Date of discharge and from where:  05/14/2021 from [place] [facility] ? ?Attempts:  3rd Attempt ? ?Reason for unsuccessful TCM follow-up call:  Unable to reach patient ? ? ? ?

## 2021-05-26 ENCOUNTER — Emergency Department (HOSPITAL_BASED_OUTPATIENT_CLINIC_OR_DEPARTMENT_OTHER)
Admission: EM | Admit: 2021-05-26 | Discharge: 2021-05-26 | Disposition: A | Discharge status: Home / Self Care | Payer: Medicaid Other | Attending: Emergency Medicine | Admitting: Emergency Medicine

## 2021-05-26 ENCOUNTER — Encounter (HOSPITAL_BASED_OUTPATIENT_CLINIC_OR_DEPARTMENT_OTHER): Payer: Self-pay

## 2021-05-26 ENCOUNTER — Other Ambulatory Visit: Payer: Self-pay

## 2021-05-26 ENCOUNTER — Emergency Department (HOSPITAL_BASED_OUTPATIENT_CLINIC_OR_DEPARTMENT_OTHER): Payer: Medicaid Other | Admitting: Radiology

## 2021-05-26 DIAGNOSIS — M545 Low back pain, unspecified: Secondary | ICD-10-CM | POA: Insufficient documentation

## 2021-05-26 MED ORDER — NAPROXEN 500 MG PO TABS: 500.0000 mg | ORAL_TABLET | Freq: Two times a day (BID) | ORAL | 0 refills | Status: DC

## 2021-05-26 MED ORDER — IBUPROFEN 400 MG PO TABS
600.0000 mg | ORAL_TABLET | Freq: Once | ORAL | Status: AC
  Administered 2021-05-26: 600 mg via ORAL
  Filled 2021-05-26: qty 1

## 2021-05-26 MED ORDER — METHOCARBAMOL 500 MG PO TABS: 500.0000 mg | ORAL_TABLET | Freq: Two times a day (BID) | ORAL | 0 refills | Status: DC

## 2021-05-26 NOTE — Discharge Instructions (Signed)
Your xray showed some degenerative changes however otherwise was negative for acute findings.   Please pick up medication and take as needed. 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   Follow up with your PCP for further evaluation. If you do not have one you can follow up with [facility] for primary care needs.   Return to the ED for any new/worsening symptoms.

## 2021-05-26 NOTE — ED Provider Notes (Signed)
MEDCENTER [facility] EMERGENCY DEPT Provider Note   CSN: [id number] Arrival date & time: 05/26/21  1006     History  Chief Complaint  Patient presents with   Back Pain    Brandon Woods is a 46 y.o. male who presents to the ED today with complaint of gradual onset, intermittent, lower back pain that began yesterday.  Per chart review patient was seen in the ED on 05/11 after feeling a pulling sensation in his back with heavy lifting however it decreased/subsided prior to visit.  Suspected muscle spasm and was discharged home.  No imaging warranted at that time.  Patient states his pain had gone away however yesterday he was lifting something heavy at work that approximates a weight of about 40 pounds.  He states that he did not have immediate pain however afterwards noticed similar pain to what he was experiencing a couple of days ago.  He states whenever he moves he feels a "shifting" sensation in his back.  He is not taking anything for pain.  He denies any radiation down his legs, weakness/numbness/tingling down legs, saddle anesthesia, urinary retention, urinary or bowel incontinence.  He has no other complaints at this time.  Denies any previous back surgeries. No hx IVDA.   The history is provided by the patient and medical records.      Home Medications Prior to Admission medications   Medication Sig Start Date End Date Taking? Authorizing Provider  methocarbamol (ROBAXIN) 500 MG tablet Take 1 tablet (500 mg total) by mouth 2 (two) times daily. 05/26/21  Yes Bodhi Moradi, PA-C  naproxen (NAPROSYN) 500 MG tablet Take 1 tablet (500 mg total) by mouth 2 (two) times daily. 05/26/21  Yes Jeshua Ransford, PA-C  NON FORMULARY Eye drop for pink eye And a pill for his eye    [provider]  ondansetron (ZOFRAN-ODT) 4 MG disintegrating tablet Take 1 tablet (4 mg total) by mouth every 8 (eight) hours as needed for nausea or vomiting. 03/11/21   Mardella Layman, MD  dicyclomine  (BENTYL) 20 MG tablet Take 1 tablet (20 mg total) by mouth 4 (four) times daily -  before meals and at bedtime. 01/01/18 11/24/18  Shaune Pollack, MD      Allergies    Patient has no known allergies.    Review of Systems   Review of Systems  Constitutional:  Negative for chills and fever.  Genitourinary:  Negative for difficulty urinating.  Musculoskeletal:  Positive for back pain. Negative for arthralgias.  Neurological:  Negative for weakness and numbness.  All other systems reviewed and are negative.  Physical Exam Updated Vital Signs BP 122/88 (BP Location: Right Arm)   Pulse 66   Temp 97.8 F (36.6 C)   Resp 16   Ht 5\' 5"  (1.651 m)   Wt 104.3 kg   SpO2 100%   BMI 38.26 kg/m  Physical Exam Vitals and nursing note reviewed.  Constitutional:      Appearance: He is not ill-appearing.  HENT:     Head: Normocephalic and atraumatic.  Eyes:     Conjunctiva/sclera: Conjunctivae normal.  Cardiovascular:     Rate and Rhythm: Normal rate and regular rhythm.  Pulmonary:     Effort: Pulmonary effort is normal.     Breath sounds: Normal breath sounds.  Musculoskeletal:     Comments: Mild midline lumbar spinal TTP with associated bilateral paralumbar musculature TTP. No stepoffs or deformities palpated. ROM intact to neck and back. Strength 5/5 to BLEs. Sensation  intact throughout. 2+ PT pulses bilaterally.   Skin:    General: Skin is warm and dry.     Coloration: Skin is not jaundiced.  Neurological:     Mental Status: He is alert.    ED Results / Procedures / Treatments   Labs (all labs ordered are listed, but only abnormal results are displayed) Labs Reviewed - No data to display  EKG None  Radiology DG Lumbar Spine Complete  Result Date: 05/26/2021 CLINICAL DATA:  Mid to low back pain beginning yesterday. Lifting injury. EXAM: LUMBAR SPINE - COMPLETE 4+ VIEW COMPARISON:  None FINDINGS: Normal alignment. No disc space narrowing. 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 No pars defect. No fracture or focal lesion. Sacroiliac joints appear normal. IMPRESSION: No acute or traumatic finding. 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 Electronically Signed   By: Paulina Fusi M.D.   On: 05/26/2021 11:18    Procedures Procedures    Medications Ordered in ED Medications  ibuprofen (ADVIL) tablet 600 mg (600 mg Oral Given 05/26/21 1126)    ED Course/ Medical Decision Making/ A&P                           Medical Decision Making 46 year old male who presents to the ED today with complaint of intermittent lower back pain.  Recently seen about 2 weeks ago for similar symptoms however no imaging warranted at that time and pain did go away.  Does do heavy lifting at work and is on his feet quite frequently at work.  Pain began again yesterday after heavy lifting.  On arrival to the ED today vitals are stable.  Patient appears to be in no acute distress.  He is noted to have some mild midline lumbar spinal tenderness palpation with associated bilateral paralumbar musculature tenderness palpation.  Denies any red flag symptoms today concerning for cauda equina, spinal epidural abscess, AAA.  He denies any radicular type symptoms.  Even this is his second ED visit without imaging in the past we will obtain x-ray of the lumbar spine at this time.  We will plan for ibuprofen for symptomatic relief and reevaluation.  Suspect muscular strain more than anything.  No falls to suggest vertebral fracture.  No findings to suggest sciatica or herniated disc. Anticipate discharge home with PCP follow up.   Xray of L spine independently interpreted by myself - no acute findings. Confirmed by radiologist with ordinary mild lower lumbar facet degenerative changes.   Workup reassuring at this time. Will plan to discharge pt home today with Rx robaxin and naproxen for symptomatic relief. Advised to follow up with his PCP for further eval. Advised to refrain from  significant heavy lifting at this time. Pt is in agreement with plan and stable for discharge home.    Amount and/or Complexity of Data Reviewed Radiology: ordered and independent interpretation performed. Decision-making details documented in ED Course.          Final Clinical Impression(s) / ED Diagnoses Final diagnoses:  Acute low back pain without sciatica, unspecified back pain laterality    Rx / DC Orders ED Discharge Orders          Ordered    methocarbamol (ROBAXIN) 500 MG tablet  2 times daily        05/26/21 1141    naproxen (NAPROSYN) 500 MG tablet  2 times daily        05/26/21 1141  Discharge Instructions      Your xray showed some degenerative changes however otherwise was negative for acute findings.   Please pick up medication and take as needed. 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   Follow up with your PCP for further evaluation. If you do not have one you can follow up with [facility] for primary care needs.   Return to the ED for any new/worsening symptoms.         Tanda RockersVenter, Morgon Pamer, PA-C 05/26/21 1143    Cathren LaineSteinl, Kevin, MD 05/26/21 [phone]

## 2021-05-26 NOTE — ED Notes (Signed)
Pt provided discharge instructions and prescription information. Pt was given the opportunity to ask questions regarding DC instructions and medications. Questions answered.

## 2021-05-26 NOTE — ED Triage Notes (Signed)
Pt. States he has mid to lower back pain. States pain started yesterday. Pain  3/10. States was lifting heavy boxes yesterday and woke up with pain in back.

## 2021-05-27 ENCOUNTER — Telehealth: Payer: Self-pay

## 2021-05-27 NOTE — Telephone Encounter (Signed)
Transition Care Management Follow-up Telephone Call Date of discharge and from where: 05/26/2021 from [facility]  How have you been since you were released from the hospital? Patient stated that he is feeling better and did not have any questions or concerns at this time.  Any questions or concerns? No  Items Reviewed: Did the pt receive and understand the discharge instructions provided? Yes  Medications obtained and verified? Yes  Other? No  Any new allergies since your discharge? No  Dietary orders reviewed? No Do you have support at home? Yes   Functional Questionnaire: (I = Independent and D = Dependent) ADLs: I  Bathing/Dressing- I  Meal Prep- I  Eating- I  Maintaining continence- I  Transferring/Ambulation- I  Managing Meds- I   Follow up appointments reviewed:  PCP Hospital f/u appt confirmed? No  Patient stated that he would call [facility] to est care. Specialist Hospital f/u appt confirmed? No   Are transportation arrangements needed? No  If their condition worsens, is the pt aware to call PCP or go to the Emergency Dept.? Yes Was the patient provided with contact information for the PCP's office or ED? Yes Was to pt encouraged to call back with questions or concerns? Yes

## 2021-07-19 ENCOUNTER — Other Ambulatory Visit: Payer: Self-pay

## 2021-07-19 ENCOUNTER — Encounter (HOSPITAL_BASED_OUTPATIENT_CLINIC_OR_DEPARTMENT_OTHER): Payer: Self-pay | Admitting: Emergency Medicine

## 2021-07-19 DIAGNOSIS — R221 Localized swelling, mass and lump, neck: Secondary | ICD-10-CM | POA: Diagnosis not present

## 2021-07-19 DIAGNOSIS — R059 Cough, unspecified: Secondary | ICD-10-CM | POA: Insufficient documentation

## 2021-07-19 DIAGNOSIS — T7840XA Allergy, unspecified, initial encounter: Secondary | ICD-10-CM | POA: Diagnosis present

## 2021-07-19 MED ORDER — DEXAMETHASONE SODIUM PHOSPHATE 10 MG/ML IJ SOLN
10.0000 mg | Freq: Once | INTRAMUSCULAR | Status: AC
  Administered 2021-07-19: 10 mg via INTRAMUSCULAR
  Filled 2021-07-19: qty 1

## 2021-07-19 MED ORDER — EPINEPHRINE 0.3 MG/0.3ML IJ SOAJ: 0.3000 mg | INTRAMUSCULAR | 0 refills | Status: AC | PRN

## 2021-07-19 MED ORDER — DIPHENHYDRAMINE HCL 25 MG PO CAPS
50.0000 mg | ORAL_CAPSULE | Freq: Once | ORAL | Status: AC
  Administered 2021-07-19: 50 mg via ORAL
  Filled 2021-07-19: qty 2

## 2021-07-19 NOTE — ED Provider Notes (Signed)
[facility] EMERGENCY DEPT Provider Note   CSN: [id number] Arrival date & time: 07/19/21  2056     History {Add pertinent medical, surgical, social history, OB history to HPI:1} Chief Complaint  Patient presents with   Allergic Reaction    Brandon Woods is a 46 y.o. male.  Patient presents with mild cough and feeling hoarse.  Started after eating melon patient had melena in the past with no difficulty multiple times.  Patient denies any stroke symptoms at this time.  Patient has no sensation of throat closing, wheezing or shortness of breath.  No fever or chills.  No vomiting.       Home Medications Prior to Admission medications   Medication Sig Start Date End Date Taking? Authorizing Provider  methocarbamol (ROBAXIN) 500 MG tablet Take 1 tablet (500 mg total) by mouth 2 (two) times daily. 05/26/21   Hyman Hopes, Margaux, PA-C  naproxen (NAPROSYN) 500 MG tablet Take 1 tablet (500 mg total) by mouth 2 (two) times daily. 05/26/21   Tanda Rockers, PA-C  NON FORMULARY Eye drop for pink eye And a pill for his eye    [provider]  ondansetron (ZOFRAN-ODT) 4 MG disintegrating tablet Take 1 tablet (4 mg total) by mouth every 8 (eight) hours as needed for nausea or vomiting. 03/11/21   Mardella Layman, MD  dicyclomine (BENTYL) 20 MG tablet Take 1 tablet (20 mg total) by mouth 4 (four) times daily -  before meals and at bedtime. 01/01/18 11/24/18  Shaune Pollack, MD      Allergies    Patient has no known allergies.    Review of Systems   Review of Systems  Constitutional:  Negative for chills and fever.  HENT:  Negative for congestion.   Eyes:  Negative for visual disturbance.  Respiratory:  Negative for shortness of breath.   Cardiovascular:  Negative for chest pain.  Gastrointestinal:  Negative for abdominal pain and vomiting.  Genitourinary:  Negative for dysuria and flank pain.  Musculoskeletal:  Negative for back pain, neck pain and neck stiffness.  Skin:   Negative for rash.  Neurological:  Negative for light-headedness and headaches.    Physical Exam Updated Vital Signs BP 138/86 (BP Location: Right Arm)   Pulse 75   Temp 98.4 F (36.9 C) (Oral)   Resp 18   SpO2 98%  Physical Exam Vitals and nursing note reviewed.  Constitutional:      General: He is not in acute distress.    Appearance: He is well-developed.  HENT:     Head: Normocephalic and atraumatic.     Comments: No angioedema, neck supple, no stridor.    Mouth/Throat:     Mouth: Mucous membranes are moist.  Eyes:     General:        Right eye: No discharge.        Left eye: No discharge.     Conjunctiva/sclera: Conjunctivae normal.  Neck:     Trachea: No tracheal deviation.  Cardiovascular:     Rate and Rhythm: Normal rate and regular rhythm.  Pulmonary:     Effort: Pulmonary effort is normal.     Breath sounds: Normal breath sounds.  Abdominal:     General: There is no distension.     Palpations: Abdomen is soft.     Tenderness: There is no abdominal tenderness. There is no guarding.  Musculoskeletal:     Cervical back: Normal range of motion and neck supple. No rigidity.  Skin:  General: Skin is warm.     Capillary Refill: Capillary refill takes less than 2 seconds.     Findings: No rash.  Neurological:     General: No focal deficit present.     Mental Status: He is alert.     Cranial Nerves: No cranial nerve deficit.     Sensory: No sensory deficit.     Motor: No weakness.     Gait: Gait normal.  Psychiatric:        Mood and Affect: Mood normal.     ED Results / Procedures / Treatments   Labs (all labs ordered are listed, but only abnormal results are displayed) Labs Reviewed - No data to display  EKG None  Radiology No results found.  Procedures Procedures  {Document cardiac monitor, telemetry assessment procedure when appropriate:1}  Medications Ordered in ED Medications  diphenhydrAMINE (BENADRYL) capsule 50 mg (50 mg Oral Given  07/19/21 2120)  dexamethasone (DECADRON) injection 10 mg (10 mg Intramuscular Given 07/19/21 2120)    ED Course/ Medical Decision Making/ A&P                           Medical Decision Making Risk Prescription drug management.   Patient presents with minimal throat symptoms without signs of infection, peritonsillar abscess, angioedema or other significant pathology.  Did start after eating melon discussed possibly mild allergic reaction, steroids and Benadryl ordered and plan to observe and recheck in the emergency room.  Vital signs normal.  No signs of acute neurologic event.  Discussed reasons to return.  {Document critical care time when appropriate:1} {Document review of labs and clinical decision tools ie heart score, Chads2Vasc2 etc:1}  {Document your independent review of radiology images, and any outside records:1} {Document your discussion with family members, caretakers, and with consultants:1} {Document social determinants of health affecting pt's care:1} {Document your decision making why or why not admission, treatments were needed:1} Final Clinical Impression(s) / ED Diagnoses Final diagnoses:  None    Rx / DC Orders ED Discharge Orders     None

## 2021-07-19 NOTE — ED Triage Notes (Signed)
Just prior to arrival Pt ate honey dew and cantaloupe and started coughing and felt hoarse. Unknown allergies. Denies pain. States he just feels weird and is talking funny.

## 2021-07-19 NOTE — Discharge Instructions (Signed)
For breathing difficulties use EpiPen and call ambulance or return to the ER. For itching or hives use Benadryl as needed every 6 hours. Return for stroke symptoms or new concerns.

## 2021-07-20 ENCOUNTER — Emergency Department (HOSPITAL_BASED_OUTPATIENT_CLINIC_OR_DEPARTMENT_OTHER)
Admission: EM | Admit: 2021-07-20 | Discharge: 2021-07-20 | Disposition: A | Discharge status: Home / Self Care | Payer: Medicaid Other | Attending: Emergency Medicine | Admitting: Emergency Medicine

## 2021-07-20 DIAGNOSIS — R221 Localized swelling, mass and lump, neck: Secondary | ICD-10-CM

## 2021-07-20 NOTE — ED Notes (Signed)
Pt discharged home after verbalizing understanding of discharge instructions; nad noted. 

## 2021-08-09 ENCOUNTER — Encounter (HOSPITAL_BASED_OUTPATIENT_CLINIC_OR_DEPARTMENT_OTHER): Payer: Self-pay

## 2021-08-09 ENCOUNTER — Other Ambulatory Visit: Payer: Self-pay

## 2021-08-09 DIAGNOSIS — T84293A Other mechanical complication of internal fixation device of bones of foot and toes, initial encounter: Secondary | ICD-10-CM | POA: Diagnosis not present

## 2021-08-09 DIAGNOSIS — M25572 Pain in left ankle and joints of left foot: Secondary | ICD-10-CM | POA: Diagnosis not present

## 2021-08-09 NOTE — ED Triage Notes (Signed)
POV, pt c/o left ankle swelling that he noticed this morning, sts that he injured ankle falling out of tree approx 4 yrs ago and has had on/off issues since then. Pt ambulatory and able to bear some weight on ankle. Alert and oriented x 4.

## 2021-08-10 ENCOUNTER — Emergency Department (HOSPITAL_BASED_OUTPATIENT_CLINIC_OR_DEPARTMENT_OTHER)
Admission: EM | Admit: 2021-08-10 | Discharge: 2021-08-10 | Disposition: A | Discharge status: Home / Self Care | Payer: Medicaid Other | Attending: Emergency Medicine | Admitting: Emergency Medicine

## 2021-08-10 ENCOUNTER — Emergency Department (HOSPITAL_BASED_OUTPATIENT_CLINIC_OR_DEPARTMENT_OTHER): Payer: Medicaid Other | Admitting: Radiology

## 2021-08-10 DIAGNOSIS — M25572 Pain in left ankle and joints of left foot: Secondary | ICD-10-CM

## 2021-08-10 DIAGNOSIS — T84498A Other mechanical complication of other internal orthopedic devices, implants and grafts, initial encounter: Secondary | ICD-10-CM

## 2021-08-10 DIAGNOSIS — R600 Localized edema: Secondary | ICD-10-CM | POA: Diagnosis not present

## 2021-08-10 NOTE — ED Provider Notes (Signed)
[facility] EMERGENCY DEPT Provider Note  CSN: [id number] Arrival date & time: 08/09/21 2318  Chief Complaint(s) Ankle Pain  HPI Brandon Woods is a 46 y.o. male with a past medical history of left ankle fracture requiring ORIF performed in [place] several years ago here for left ankle pain.  Patient reports that he has been having intermittent discomfort for the past several weeks.  Tonight while at work he used his left leg to stand from a kneeling position.  He did not sustain any significant pain however he did notice increased swelling prompting his visit.  Reports that the pain has been having usually in the morning and improves after he has been up and walking for a bit.  He does notice that if he is on his feet too long that he will start having more discomfort.  He denies any fall or trauma.  No fevers or chills.  No other physical complaints.  He denies any prior history of DVT/PE.  No recent travel or prolonged position.  The history is provided by the patient.    Past Medical History History reviewed. No pertinent past medical history. There are no problems to display for this patient.  Home Medication(s) Prior to Admission medications   Medication Sig Start Date End Date Taking? Authorizing Provider  EPINEPHrine 0.3 mg/0.3 mL IJ SOAJ injection Inject 0.3 mg into the muscle as needed for anaphylaxis. 07/19/21   Blane Ohara, MD  methocarbamol (ROBAXIN) 500 MG tablet Take 1 tablet (500 mg total) by mouth 2 (two) times daily. 05/26/21   Hyman Hopes, Margaux, PA-C  naproxen (NAPROSYN) 500 MG tablet Take 1 tablet (500 mg total) by mouth 2 (two) times daily. 05/26/21   Tanda Rockers, PA-C  NON FORMULARY Eye drop for pink eye And a pill for his eye    [provider]  ondansetron (ZOFRAN-ODT) 4 MG disintegrating tablet Take 1 tablet (4 mg total) by mouth every 8 (eight) hours as needed for nausea or vomiting. 03/11/21   Mardella Layman, MD  dicyclomine (BENTYL) 20 MG  tablet Take 1 tablet (20 mg total) by mouth 4 (four) times daily -  before meals and at bedtime. 01/01/18 11/24/18  Shaune Pollack, MD                                                                                                                                    Allergies Patient has no known allergies.  Review of Systems Review of Systems As noted in HPI  Physical Exam Vital Signs  I have reviewed the triage vital signs BP 121/78   Pulse (!) 59   Temp 98.8 F (37.1 C) (Oral)   Resp 17   Ht 5\' 5"  (1.651 m)   Wt 102.4 kg   SpO2 100%   BMI 37.57 kg/m   Physical Exam Vitals reviewed.  Constitutional:      General: He is not in acute distress.  Appearance: He is well-developed. He is not diaphoretic.  HENT:     Head: Normocephalic and atraumatic.     Right Ear: External ear normal.     Left Ear: External ear normal.     Nose: Nose normal.     Mouth/Throat:     Mouth: Mucous membranes are moist.  Eyes:     General: No scleral icterus.    Conjunctiva/sclera: Conjunctivae normal.  Neck:     Trachea: Phonation normal.  Cardiovascular:     Rate and Rhythm: Normal rate and regular rhythm.  Pulmonary:     Effort: Pulmonary effort is normal. No respiratory distress.     Breath sounds: No stridor.  Abdominal:     General: There is no distension.  Musculoskeletal:     Cervical back: Normal range of motion.     Right ankle: No swelling or deformity. No tenderness. Normal range of motion.     Left ankle: Swelling present. No ecchymosis. Tenderness (mild) present. Decreased range of motion.  Neurological:     Mental Status: He is alert and oriented to person, place, and time.  Psychiatric:        Behavior: Behavior normal.     ED Results and Treatments Labs (all labs ordered are listed, but only abnormal results are displayed) Labs Reviewed - No data to display                                                                                                                        EKG  EKG Interpretation  Date/Time:    Ventricular Rate:    PR Interval:    QRS Duration:   QT Interval:    QTC Calculation:   R Axis:     Text Interpretation:         Radiology DG Ankle Complete Left  Result Date: 08/10/2021 CLINICAL DATA:  Ankle swelling.  Injury 4 years ago. EXAM: LEFT ANKLE COMPLETE - 3+ VIEW COMPARISON:  None Available. FINDINGS: Remote postsurgical and posttraumatic deformity of the ankle. Moderate plate and multi screw fixation of the fibula. A syndesmotic screw is broken in the region of the lateral aspect of the tibia. Medial plate and multi screw fixation of the distal tibia with additional K-wire fixation. There is no periprosthetic lucency. Ossification of the distal tibia/fibula syndesmosis is likely posttraumatic. Well corticated densities distal to the medial malleolus. The ankle mortise is preserved. No definite ankle joint effusion. Generalized soft tissue edema. No soft tissue gas. No radiopaque foreign body. Small plantar calcaneal spur and Achilles tendon enthesophyte. IMPRESSION: 1. Remote postsurgical and posttraumatic deformity of the ankle. A syndesmotic screw is broken in the region of the lateral tibia. 2. Generalized soft tissue edema. Electronically Signed   By: Narda Rutherford M.D.   On: 08/10/2021 01:56    Pertinent labs & imaging results that were available during my care of the patient were reviewed by me and considered in my medical decision making (see MDM for details).  Medications Ordered in ED Medications - No data to display                                                                                                                                   Procedures Procedures  (including critical care time)  Medical Decision Making / ED Course    Complexity of Problem:  Co-morbidities/SDOH that complicate the patient evaluation/care: Noted above  Patient's presenting problem/concern, DDX, and MDM listed below: Left  ankle pain and swelling Low suspicion for septic arthritis.  Doubt DVT.  Doubt fracture or dislocation given lack of trauma but will obtain x-ray to assess hardware.  Other possibility include exacerbation of arthritis related to previous surgery   Complexity of Data:    Imaging Studies ordered listed below with my independent interpretation: X-ray notable for broken screw.  No acute fractures.     ED Course:    Assessment, Add'l Intervention, and Reassessment: Left ankle discomfort Possibly related to broken screw versus arthritis from prior fractures. Patient provided with a cam walker. RICE recommended. Provided with orthopedic surgery information for follow-up    Final Clinical Impression(s) / ED Diagnoses Final diagnoses:  Acute left ankle pain  Internal fixation device (pin, rod, or screw) mechanical complication, initial encounter [facility])   The patient appears reasonably screened and/or stabilized for discharge and I doubt any other medical condition or other [facility] requiring further screening, evaluation, or treatment in the ED at this time. I have discussed the findings, Dx and Tx plan with the patient/family who expressed understanding and agree(s) with the plan. Discharge instructions discussed at length. The patient/family was given strict return precautions who verbalized understanding of the instructions. No further questions at time of discharge.  Disposition: Discharge  Condition: Good  ED Discharge Orders     None        Follow Up: Primary care provider  Schedule an appointment as soon as possible for a visit  if you do not have a primary care physician, contact [facility] at [phone] for referral  Samson Frederic, MD [address] [place] [address] [place] [address] [phone]  Call  as needed, to schedule an appointment for close follow up           This chart was dictated using voice recognition software.  Despite best efforts to  proofread,  errors can occur which can change the documentation meaning.    Nira Conn, MD 08/10/21 [phone]

## 2021-08-21 DIAGNOSIS — S93492A Sprain of other ligament of left ankle, initial encounter: Secondary | ICD-10-CM | POA: Diagnosis not present

## 2021-08-21 DIAGNOSIS — M79672 Pain in left foot: Secondary | ICD-10-CM | POA: Diagnosis not present

## 2021-09-04 DIAGNOSIS — S93492A Sprain of other ligament of left ankle, initial encounter: Secondary | ICD-10-CM | POA: Diagnosis not present

## 2022-02-11 DIAGNOSIS — R197 Diarrhea, unspecified: Secondary | ICD-10-CM | POA: Diagnosis not present

## 2022-02-11 DIAGNOSIS — R059 Cough, unspecified: Secondary | ICD-10-CM | POA: Diagnosis not present

## 2022-02-11 DIAGNOSIS — J020 Streptococcal pharyngitis: Secondary | ICD-10-CM | POA: Diagnosis not present

## 2022-03-31 DIAGNOSIS — H6121 Impacted cerumen, right ear: Secondary | ICD-10-CM | POA: Diagnosis not present

## 2022-08-24 ENCOUNTER — Emergency Department (HOSPITAL_BASED_OUTPATIENT_CLINIC_OR_DEPARTMENT_OTHER)
Admission: EM | Admit: 2022-08-24 | Discharge: 2022-08-24 | Disposition: A | Discharge status: Home / Self Care | Payer: Medicaid Other | Attending: Emergency Medicine | Admitting: Emergency Medicine

## 2022-08-24 ENCOUNTER — Other Ambulatory Visit: Payer: Self-pay

## 2022-08-24 ENCOUNTER — Encounter (HOSPITAL_BASED_OUTPATIENT_CLINIC_OR_DEPARTMENT_OTHER): Payer: Self-pay

## 2022-08-24 DIAGNOSIS — H5789 Other specified disorders of eye and adnexa: Secondary | ICD-10-CM | POA: Diagnosis present

## 2022-08-24 DIAGNOSIS — H5213 Myopia, bilateral: Secondary | ICD-10-CM | POA: Insufficient documentation

## 2022-08-24 NOTE — ED Triage Notes (Addendum)
Pt reports " think I have blurred vision, I can see fine, but I think it gets blurry". Pt reports started early this morning. Pt reports bought a pair of dollar store glasses earlier today that made his vision worse. No other s/ or complaints. VSS, NAD noted.

## 2022-08-24 NOTE — ED Provider Notes (Signed)
[place] EMERGENCY DEPARTMENT AT [facility]  Provider Note  CSN: [id number] Arrival date & time: 08/24/22 1943  History Chief Complaint  Patient presents with   Eye Problem    Brandon Woods is a 47 y.o. male reports several months of occasional blurry vision, especially his distance vision. His symptoms did not start today as noted in Triage. He bought some reading glasses OTC several weeks ago but has not noticed any improvement. He denies any pain, no drainage. No loss of vision. He can read up close on his phone without difficulty.    Home Medications Prior to Admission medications   Medication Sig Start Date End Date Taking? Authorizing Provider  EPINEPHrine 0.3 mg/0.3 mL IJ SOAJ injection Inject 0.3 mg into the muscle as needed for anaphylaxis. 07/19/21   Blane Ohara, MD  methocarbamol (ROBAXIN) 500 MG tablet Take 1 tablet (500 mg total) by mouth 2 (two) times daily. 05/26/21   Hyman Hopes, Margaux, PA-C  naproxen (NAPROSYN) 500 MG tablet Take 1 tablet (500 mg total) by mouth 2 (two) times daily. 05/26/21   Tanda Rockers, PA-C  NON FORMULARY Eye drop for pink eye And a pill for his eye    [provider]  ondansetron (ZOFRAN-ODT) 4 MG disintegrating tablet Take 1 tablet (4 mg total) by mouth every 8 (eight) hours as needed for nausea or vomiting. 03/11/21   Mardella Layman, MD  dicyclomine (BENTYL) 20 MG tablet Take 1 tablet (20 mg total) by mouth 4 (four) times daily -  before meals and at bedtime. 01/01/18 11/24/18  Shaune Pollack, MD     Allergies    Patient has no known allergies.   Review of Systems   Review of Systems Please see HPI for pertinent positives and negatives  Physical Exam BP (!) 138/90 (BP Location: Right Arm)   Pulse (!) 113   Temp 98.6 F (37 C)   Resp 16   SpO2 100%   Physical Exam Vitals and nursing note reviewed.  HENT:     Head: Normocephalic.     Nose: Nose normal.  Eyes:     Extraocular Movements: Extraocular movements  intact.     Pupils: Pupils are equal, round, and reactive to light.     Comments: Eyes are grossly normal.  Pulmonary:     Effort: Pulmonary effort is normal.  Musculoskeletal:        General: Normal range of motion.     Cervical back: Neck supple.  Skin:    Findings: No rash (on exposed skin).  Neurological:     Mental Status: He is alert and oriented to person, place, and time.  Psychiatric:        Mood and Affect: Mood normal.     ED Results / Procedures / Treatments   EKG None  Procedures Procedures  Medications Ordered in the ED Medications - No data to display  Initial Impression and Plan  Patient with mild myopia on visual acuity, otherwise his exam is benign. Advised he needs a formal eye exam to determine need for corrective lenses. Otherwise no emergent medical condition identified.   ED Course       MDM Rules/Calculators/A&P Medical Decision Making Problems Addressed: Myopia of both eyes: undiagnosed new problem with uncertain prognosis     Final Clinical Impression(s) / ED Diagnoses Final diagnoses:  Myopia of both eyes    Rx / DC Orders ED Discharge Orders     None        Bernette Mayers,  Bonnita Levan, MD 08/24/22 2303

## 2022-09-11 ENCOUNTER — Encounter (HOSPITAL_BASED_OUTPATIENT_CLINIC_OR_DEPARTMENT_OTHER): Payer: Self-pay

## 2022-09-11 ENCOUNTER — Emergency Department (HOSPITAL_BASED_OUTPATIENT_CLINIC_OR_DEPARTMENT_OTHER)
Admission: EM | Admit: 2022-09-11 | Discharge: 2022-09-11 | Disposition: A | Discharge status: Home / Self Care | Payer: Medicaid Other | Attending: Emergency Medicine | Admitting: Emergency Medicine

## 2022-09-11 ENCOUNTER — Telehealth (HOSPITAL_BASED_OUTPATIENT_CLINIC_OR_DEPARTMENT_OTHER): Payer: Self-pay | Admitting: Emergency Medicine

## 2022-09-11 DIAGNOSIS — R739 Hyperglycemia, unspecified: Secondary | ICD-10-CM | POA: Diagnosis present

## 2022-09-11 DIAGNOSIS — E1165 Type 2 diabetes mellitus with hyperglycemia: Secondary | ICD-10-CM | POA: Insufficient documentation

## 2022-09-11 LAB — URINALYSIS, ROUTINE W REFLEX MICROSCOPIC
Bacteria, UA: NONE SEEN
Bilirubin Urine: NEGATIVE
Glucose, UA: >1000 mg/dL — AB
Hgb urine dipstick: NEGATIVE
Ketones, ur: 15 mg/dL — AB
Leukocytes,Ua: NEGATIVE
Nitrite: NEGATIVE
Specific Gravity, Urine: 1.044 — ABNORMAL HIGH (ref 1.005–1.030)
pH: 5 (ref 5.0–8.0)

## 2022-09-11 LAB — CBC
HCT: 37.7 % — ABNORMAL LOW (ref 39.0–52.0)
Hemoglobin: 13.5 g/dL (ref 13.0–17.0)
MCH: 29.9 pg (ref 26.0–34.0)
MCHC: 35.8 g/dL (ref 30.0–36.0)
MCV: 83.6 fL (ref 80.0–100.0)
Platelets: 147 10*3/uL — ABNORMAL LOW (ref 150–400)
RBC: 4.51 MIL/uL (ref 4.22–5.81)
RDW: 13 % (ref 11.5–15.5)
WBC: 5.4 10*3/uL (ref 4.0–10.5)
nRBC: 0 % (ref 0.0–0.2)

## 2022-09-11 LAB — BASIC METABOLIC PANEL
Anion gap: 9 (ref 5–15)
BUN: 9 mg/dL (ref 6–20)
CO2: 26 mmol/L (ref 22–32)
Calcium: 8.7 mg/dL — ABNORMAL LOW (ref 8.9–10.3)
Chloride: 98 mmol/L (ref 98–111)
Creatinine, Ser: 0.71 mg/dL (ref 0.61–1.24)
GFR, Estimated: >60 mL/min (ref >60)
Glucose, Bld: 544 mg/dL (ref 70–99)
Potassium: 3.9 mmol/L (ref 3.5–5.1)
Sodium: 133 mmol/L — ABNORMAL LOW (ref 135–145)

## 2022-09-11 LAB — CBG MONITORING, ED
Glucose-Capillary: 463 mg/dL — ABNORMAL HIGH (ref 70–99)
Glucose-Capillary: 296 mg/dL — ABNORMAL HIGH (ref 70–99)

## 2022-09-11 LAB — I-STAT VENOUS BLOOD GAS, ED
Acid-base deficit: 3 mmol/L — ABNORMAL HIGH (ref 0.0–2.0)
Bicarbonate: 24.4 mmol/L (ref 20.0–28.0)
Calcium, Ion: 1.19 mmol/L (ref 1.15–1.40)
HCT: 39 % (ref 39.0–52.0)
Hemoglobin: 13.3 g/dL (ref 13.0–17.0)
O2 Saturation: 65 %
Patient temperature: 36.7
Potassium: 3.6 mmol/L (ref 3.5–5.1)
Sodium: 134 mmol/L — ABNORMAL LOW (ref 135–145)
TCO2: 26 mmol/L (ref 22–32)
pCO2, Ven: 49.8 mmHg (ref 44–60)
pH, Ven: 7.297 (ref 7.25–7.43)
pO2, Ven: 37 mmHg (ref 32–45)

## 2022-09-11 MED ORDER — METFORMIN HCL 500 MG PO TABS: 500.0000 mg | ORAL_TABLET | Freq: Two times a day (BID) | ORAL | 1 refills | Status: DC

## 2022-09-11 MED ORDER — INSULIN ASPART 100 UNIT/ML IJ SOLN
6.0000 [IU] | Freq: Once | INTRAMUSCULAR | Status: AC
  Administered 2022-09-11: 6 [IU] via INTRAVENOUS

## 2022-09-11 MED ORDER — SODIUM CHLORIDE 0.9 % IV BOLUS
1000.0000 mL | Freq: Once | INTRAVENOUS | Status: AC
  Administered 2022-09-11: 1000 mL via INTRAVENOUS

## 2022-09-11 MED ORDER — LANCET DEVICE MISC: 1.0000 | Freq: Three times a day (TID) | 0 refills | Status: AC

## 2022-09-11 MED ORDER — BLOOD GLUCOSE MONITORING SUPPL DEVI: 1.0000 | Freq: Three times a day (TID) | 0 refills | Status: AC

## 2022-09-11 MED ORDER — ACCU-CHEK SOFTCLIX LANCETS MISC: 12 refills | Status: AC

## 2022-09-11 MED ORDER — LANCETS MISC. MISC: 1.0000 | Freq: Three times a day (TID) | 0 refills | Status: AC

## 2022-09-11 MED ORDER — BLOOD GLUCOSE TEST VI STRP: 1.0000 | ORAL_STRIP | Freq: Three times a day (TID) | 0 refills | Status: AC

## 2022-09-11 NOTE — ED Notes (Signed)
CBG 463.  Patient appears in no distress.  Resp WNL.  Denies nausea and vomiting.  States vomited twice two days ago.

## 2022-09-11 NOTE — ED Provider Notes (Signed)
[place] EMERGENCY DEPARTMENT AT [facility] Provider Note   CSN: [id number] Arrival date & time: 09/11/22  1214     History  Chief Complaint  Patient presents with   Hyperglycemia    Jayleen Joas is a 47 y.o. male with overall expiratory past medical history presents with concern for some tingling in the third fourth and fifth digit of the right hand.  Patient does report that he was sleeping on his arms last night.  He also endorses some urinary frequency, polyuria, polydipsia for the whole summer, no previous history of diabetes, patient endorses family history of diabetes.  He denies any recent dietary changes.  He reports some occasional beer drinking, denies excessive alcohol use.   Hyperglycemia      Home Medications Prior to Admission medications   Medication Sig Start Date End Date Taking? Authorizing Provider  Accu-Chek Softclix Lancets lancets Use as instructed 09/11/22  Yes Lumen Brinlee H, PA-C  metFORMIN (GLUCOPHAGE) 500 MG tablet Take 1 tablet (500 mg total) by mouth 2 (two) times daily with a meal. 09/11/22  Yes Con Arganbright H, PA-C  EPINEPHrine 0.3 mg/0.3 mL IJ SOAJ injection Inject 0.3 mg into the muscle as needed for anaphylaxis. 07/19/21   Blane Ohara, MD  methocarbamol (ROBAXIN) 500 MG tablet Take 1 tablet (500 mg total) by mouth 2 (two) times daily. 05/26/21   Hyman Hopes, Margaux, PA-C  naproxen (NAPROSYN) 500 MG tablet Take 1 tablet (500 mg total) by mouth 2 (two) times daily. 05/26/21   Tanda Rockers, PA-C  NON FORMULARY Eye drop for pink eye And a pill for his eye    [provider]  ondansetron (ZOFRAN-ODT) 4 MG disintegrating tablet Take 1 tablet (4 mg total) by mouth every 8 (eight) hours as needed for nausea or vomiting. 03/11/21   Mardella Layman, MD  dicyclomine (BENTYL) 20 MG tablet Take 1 tablet (20 mg total) by mouth 4 (four) times daily -  before meals and at bedtime. 01/01/18 11/24/18  Shaune Pollack, MD      Allergies     Patient has no known allergies.    Review of Systems   Review of Systems  All other systems reviewed and are negative.   Physical Exam Updated Vital Signs BP 134/81 (BP Location: Right Arm)   Pulse 87   Temp 98 F (36.7 C) (Oral)   Resp 16   Ht 5\' 5"  (1.651 m)   Wt 93.4 kg   SpO2 99%   BMI 34.28 kg/m  Physical Exam Vitals and nursing note reviewed.  Constitutional:      General: He is not in acute distress.    Appearance: Normal appearance.  HENT:     Head: Normocephalic and atraumatic.  Eyes:     General:        Right eye: No discharge.        Left eye: No discharge.  Cardiovascular:     Rate and Rhythm: Normal rate and regular rhythm.  Pulmonary:     Effort: Pulmonary effort is normal. No respiratory distress.  Musculoskeletal:        General: No deformity.     Comments: Intact strength 5/5 bilateral upper and lower extremities.  Intact coordination.  Skin:    General: Skin is warm and dry.     Comments: Patient endorses some difference in sensation of the third fourth and fifth digits on the right hand, otherwise normal sensation throughout the entire right upper extremity.  Neurological:  Mental Status: He is alert and oriented to person, place, and time.  Psychiatric:        Mood and Affect: Mood normal.        Behavior: Behavior normal.     ED Results / Procedures / Treatments   Labs (all labs ordered are listed, but only abnormal results are displayed) Labs Reviewed  BASIC METABOLIC PANEL - Abnormal; Notable for the following components:      Result Value   Sodium 133 (*)    Glucose, Bld 544 (*)    Calcium 8.7 (*)    All other components within normal limits  CBC - Abnormal; Notable for the following components:   HCT 37.7 (*)    Platelets 147 (*)    All other components within normal limits  URINALYSIS, ROUTINE W REFLEX MICROSCOPIC - Abnormal; Notable for the following components:   Specific Gravity, Urine 1.044 (*)    Glucose, UA >1,000  (*)    Ketones, ur 15 (*)    Protein, ur TRACE (*)    All other components within normal limits  CBG MONITORING, ED - Abnormal; Notable for the following components:   Glucose-Capillary 463 (*)    All other components within normal limits  I-STAT VENOUS BLOOD GAS, ED - Abnormal; Notable for the following components:   Acid-base deficit 3.0 (*)    Sodium 134 (*)    All other components within normal limits  CBG MONITORING, ED - Abnormal; Notable for the following components:   Glucose-Capillary 296 (*)    All other components within normal limits  BETA-HYDROXYBUTYRIC ACID    EKG None  Radiology No results found.  Procedures Procedures    Medications Ordered in ED Medications  sodium chloride 0.9 % bolus 1,000 mL (1,000 mLs Intravenous New Bag/Given 09/11/22 1344)  insulin aspart (novoLOG) injection 6 Units (6 Units Intravenous Given 09/11/22 1344)    ED Course/ Medical Decision Making/ A&P                                 Medical Decision Making Amount and/or Complexity of Data Reviewed Labs: ordered.   This patient is a 47 y.o. male  who presents to the ED for concern of right hand parethesias, polyuria, polydipsia.   Differential diagnoses prior to evaluation: The emergent differential diagnosis includes, but is not limited to, peripheral neuropathy, ulnar nerve compression, carpal tunnel, possible MS presentation, versus atypical stroke presentation, considered new onset diabetes, DKA, HHS with his polyuria, polydipsia as well as possible UTI.. This is not an exhaustive differential.   Past Medical History / Co-morbidities / Social History: Overall noncontributory  Physical Exam: Physical exam performed. The pertinent findings include: Patient with normal strength, range of motion of all of the digits on the right hand.  He endorses some mild paresthesias of the third fourth and fifth digit.  No significant tenderness over ulnar nerve distribution, or carpal tunnel.   Normal range of motion and normal coordination throughout the affected extremity.  Normal sensation throughout the rest of the body, CN II through XII grossly intact.  Patient is ambulating without difficulty.  Otherwise well-appearing with stable vital signs.  Lab Tests/Imaging studies: I personally interpreted labs/imaging and the pertinent results include: Pseudohyponatremia, sodium 133 in context of glucose 544.  VBG without any evidence of acidosis, bicarb deficit.  CBC unremarkable.  UA with greater than 1000 glucose but no evidence of acute bacterial infection.  Repeat CBG after fluids, insulin at 296.  Patient feeling improved..    Medications: I ordered medication including fluids, insulin, discharged with metformin, discussed proper diet during diabetes and encourage close follow-up with PCP.  Transitions of care order placed to help patient with establishing care with primary care doctor.  Encourage close follow-up for return of polyuria, polydipsia or sugar elevated greater than 400-500..  I have reviewed the patients home medicines and have made adjustments as needed.   Disposition: After consideration of the diagnostic results and the patients response to treatment, I feel that patient with new onset hyperglycemia without signs of DKA, suspect that he had some paresthesias related to sleeping on his arms with some ulnar nerve compression given third fourth and fifth digit distribution only, improving during time in emergency department, could also represent early onset peripheral neuropathy related to new onset uncontrolled diabetes.  Discharged on metformin, diabetes education provided, and patient encouraged to follow-up closely with PCP.  He understands and agrees to this plan..   emergency department workup does not suggest an emergent condition requiring admission or immediate intervention beyond what has been performed at this time. The plan is: as above. The patient is safe for  discharge and has been instructed to return immediately for worsening symptoms, change in symptoms or any other concerns.  Final Clinical Impression(s) / ED Diagnoses Final diagnoses:  Hyperglycemia due to diabetes mellitus (HCC)    Rx / DC Orders ED Discharge Orders          Ordered    Accu-Chek Softclix Lancets lancets        09/11/22 1510    metFORMIN (GLUCOPHAGE) 500 MG tablet  2 times daily with meals        09/11/22 1510              Travone Georg, Carrick H, PA-C 09/11/22 1518    Terald Sleeper, MD 09/12/22 [phone]

## 2022-09-11 NOTE — Discharge Instructions (Addendum)
Please begin taking the medication that I prescribed twice daily with meals, avoid high carb diets, and overly sugar filled foods and drinks.  Please try to establish care with a primary care doctor as soon as you are able.  Speak with the pharmacist about getting blood glucose monitoring equipment.  I would check your blood sugar at least once daily, especially if you are feeling unwell.  Please return to the emergency department if your blood sugar is elevated, especially greater than 400-500.

## 2022-09-11 NOTE — ED Triage Notes (Signed)
States woke up with numbness to right hand three fingers.  States also having increase thirst drinking lots of fluids

## 2022-09-12 ENCOUNTER — Telehealth: Payer: Self-pay

## 2022-09-12 NOTE — Telephone Encounter (Signed)
Called patient to give him PCP information via [employer]. Left a message to return call to discuss

## 2022-10-28 ENCOUNTER — Encounter: Payer: Self-pay | Admitting: Internal Medicine

## 2022-10-28 ENCOUNTER — Ambulatory Visit: Payer: Medicaid Other | Admitting: Internal Medicine

## 2022-10-28 ENCOUNTER — Other Ambulatory Visit: Payer: Self-pay

## 2022-10-28 VITALS — BP 108/62 | HR 75 | Temp 98.3°F | Resp 28 | Ht 65.0 in | Wt 197.2 lb

## 2022-10-28 DIAGNOSIS — M25572 Pain in left ankle and joints of left foot: Secondary | ICD-10-CM | POA: Insufficient documentation

## 2022-10-28 DIAGNOSIS — E1165 Type 2 diabetes mellitus with hyperglycemia: Secondary | ICD-10-CM | POA: Diagnosis not present

## 2022-10-28 DIAGNOSIS — Z23 Encounter for immunization: Secondary | ICD-10-CM

## 2022-10-28 DIAGNOSIS — E669 Obesity, unspecified: Secondary | ICD-10-CM | POA: Diagnosis not present

## 2022-10-28 DIAGNOSIS — E119 Type 2 diabetes mellitus without complications: Secondary | ICD-10-CM

## 2022-10-28 DIAGNOSIS — R739 Hyperglycemia, unspecified: Secondary | ICD-10-CM | POA: Insufficient documentation

## 2022-10-28 MED ORDER — METFORMIN HCL 500 MG PO TABS: 1000.0000 mg | ORAL_TABLET | Freq: Two times a day (BID) | ORAL | 1 refills | Status: DC

## 2022-10-28 NOTE — Assessment & Plan Note (Signed)
Patient fractured his ankle from falling out of a tree several years ago followed by an ORIF. He continues to have some pain in his ankle and walks with a limp. His ankle flexion and extension is severely limited.  -Referral to physical therapy sent

## 2022-10-28 NOTE — Progress Notes (Addendum)
   CC: Establish care, ED follow up  HPI:  Mr.Brandon Woods is a 47 y.o. male living with a history stated below and presents today to establish care. Please see problem based assessment and plan for additional details.  Past Medical History: None diagnosed  Past Surgical History: ORIF of left humerus and ankle after falling from a tree several years ago  Medications: Metformin 500mg  BID  Allergies: None  Family History: Diabetes in multiple members of his mother's family. Uncle died of unknown cancer  Social History:  Works as a Industrial/product designer. Lives at home with wife and two children. Denies food insecurity or concerns for housing instability. Quit drinking alcohol and smoking 6 years ago. No recreational drug use.   Review of Systems: ROS negative except for what is noted on the assessment and plan.  Vitals:   10/28/22 1534  BP: 108/62  Pulse: 75  Resp: (!) 28  Temp: 98.3 F (36.8 C)  TempSrc: Oral  SpO2: 98%  Weight: 197 lb 3.2 oz (89.4 kg)  Height: 5\' 5"  (1.651 m)    Physical Exam: Constitutional: well-appearing,  in no acute distress HENT: normocephalic atraumatic, mucous membranes moist. Mostly adentulous Eyes: conjunctiva non-erythematous Neck: supple Cardiovascular: regular rate and rhythm, no m/r/g Pulmonary/Chest: normal work of breathing on room air, lungs clear to auscultation bilaterally Abdominal: soft, non-tender, non-distended MSK: normal bulk and tone. Stiffness of left ankle with very limited mobility. Nontender Neurological: alert & oriented x 3, 5/5 strength in bilateral upper and lower extremities, limping gate Skin: warm and dry  Assessment & Plan:   [name] is a 47 year old man with no diagnosed medical problems here to establish care. He was seen in the ED several weeks ago with concerns for hyperglycemia. He feels well and healthy overall. He has no other concerns except he mentions his three year old daughter can outrun him  because of stiffness in his left ankle where he fractured it and had an ORIF several years ago. He has gone to the ED this year for pain in his ankle.   Hyperglycemia without ketosis Patient recently went to the ED with concerns for right hand numbness and polyuria/polydipsia. There he was found to have blood glucose in the 400s without evidence of ketoacidosis. He was prescribed metformin 500mg  BID, which he has been taking without nausea or GI distress. He has been checking his blood sugar regularly, which have impressively remained within target goals. He has not had any further numbness or polyuria.  -A1c, Urine MCR, Lipid profile today -Increase metformin to 1000mg  BID -Referral to diabetes coordinator  Addendum: A1c resulted 8.9. Spoke with patient on the phone about adding SGLT-2 with follow up in 3 months for A1c recheck. Patient was amenable to plan.  -Jardiance 10mg  daily started  Left ankle pain Patient fractured his ankle from falling out of a tree several years ago followed by an ORIF. He continues to have some pain in his ankle and walks with a limp. His ankle flexion and extension is severely limited.  -Referral to physical therapy sent   Patient seen with Dr. Rance Muir, M.D. [facility], PGY-1 Pager: [phone] Date 11/03/2022 Time 1:27 PM

## 2022-10-28 NOTE — Assessment & Plan Note (Addendum)
Patient recently went to the ED with concerns for right hand numbness and polyuria/polydipsia. There he was found to have blood glucose in the 400s without evidence of ketoacidosis. He was prescribed metformin 500mg  BID, which he has been taking without nausea or GI distress. He has been checking his blood sugar regularly, which have impressively remained within target goals. He has not had any further numbness or polyuria.  -A1c, Urine MCR, Lipid profile today -Increase metformin to 1000mg  BID -Referral to diabetes coordinator  Addendum: A1c resulted 8.9. Spoke with patient on the phone about adding SGLT-2 with follow up in 3 months for A1c recheck. Patient was amenable to plan.  -Jardiance 10mg  daily started

## 2022-10-28 NOTE — Patient Instructions (Addendum)
Mr Allene Dillon,   It was a pleasure meeting you today.   I will call you with the results of the bloodwork we get today. You should also be getting a call from physical therapy for your ankle.   We will plan to see you again in about 6 months.   Thanks,  Dr Carlynn Purl

## 2022-10-29 LAB — LIPID PANEL
Chol/HDL Ratio: 3.7 ratio (ref 0.0–5.0)
Cholesterol, Total: 186 mg/dL (ref 100–199)
HDL: 50 mg/dL (ref >39)
LDL Chol Calc (NIH): 122 mg/dL — ABNORMAL HIGH (ref 0–99)
Triglycerides: 78 mg/dL (ref 0–149)
VLDL Cholesterol Cal: 14 mg/dL (ref 5–40)

## 2022-10-29 LAB — HEMOGLOBIN A1C
Est. average glucose Bld gHb Est-mCnc: 209 mg/dL
Hgb A1c MFr Bld: 8.9 % — ABNORMAL HIGH (ref 4.8–5.6)

## 2022-10-29 LAB — MICROALBUMIN / CREATININE URINE RATIO
Creatinine, Urine: 209.4 mg/dL
Microalb/Creat Ratio: 8 mg/g{creat} (ref 0–29)
Microalbumin, Urine: 16 ug/mL

## 2022-10-29 NOTE — Addendum Note (Signed)
Addended by: Dickie La on: 10/29/2022 10:32 AM   Modules accepted: Level of Service

## 2022-10-29 NOTE — Progress Notes (Addendum)
Internal Medicine Clinic Attending  I was physically present during the key portions of the resident provided service and participated in the medical decision making of patient's management care. I reviewed pertinent patient test results.  The assessment, diagnosis, and plan were formulated together and I agree with the documentation in the resident's note.  A1c 8.9% resulting after the visit. Patient will likely benefit from additional medical therapy for uncontrolled DM. Recommend discussion of GLP-1 vs SGLT2i, Dr. Carlynn Purl to f/u with results. Recommend 3 month f/u for repeat A1c.  Dickie La, MD

## 2022-11-01 ENCOUNTER — Telehealth: Payer: Self-pay

## 2022-11-01 ENCOUNTER — Other Ambulatory Visit: Payer: Self-pay | Admitting: Internal Medicine

## 2022-11-01 NOTE — Telephone Encounter (Signed)
[facility] pharmacy requesting refill on ACCU=chek Guide test strips.

## 2022-11-02 ENCOUNTER — Other Ambulatory Visit: Payer: Self-pay | Admitting: Internal Medicine

## 2022-11-03 ENCOUNTER — Other Ambulatory Visit: Payer: Self-pay | Admitting: Internal Medicine

## 2022-11-03 MED ORDER — EMPAGLIFLOZIN 10 MG PO TABS: 10.0000 mg | ORAL_TABLET | Freq: Every day | ORAL | 5 refills | Status: DC

## 2022-11-03 NOTE — Addendum Note (Signed)
Addended by: Monna Fam on: 11/03/2022 01:28 PM   Modules accepted: Orders

## 2022-11-05 NOTE — Progress Notes (Signed)
Internal Medicine Clinic Attending  I saw and evaluated the patient.  I personally confirmed the key portions of the history and exam documented by the resident  and I reviewed pertinent patient test results.  The assessment, diagnosis, and plan were formulated together and I agree with the documentation in the resident's note.  

## 2022-11-24 ENCOUNTER — Encounter: Payer: Self-pay | Admitting: Student

## 2022-11-24 ENCOUNTER — Encounter: Payer: Medicaid Other | Admitting: Dietician

## 2022-11-24 DIAGNOSIS — E119 Type 2 diabetes mellitus without complications: Secondary | ICD-10-CM | POA: Insufficient documentation

## 2022-12-25 ENCOUNTER — Emergency Department (HOSPITAL_BASED_OUTPATIENT_CLINIC_OR_DEPARTMENT_OTHER)
Admission: EM | Admit: 2022-12-25 | Discharge: 2022-12-26 | Disposition: A | Discharge status: Home / Self Care | Payer: Medicaid Other | Attending: Emergency Medicine | Admitting: Emergency Medicine

## 2022-12-25 ENCOUNTER — Encounter (HOSPITAL_BASED_OUTPATIENT_CLINIC_OR_DEPARTMENT_OTHER): Payer: Self-pay

## 2022-12-25 ENCOUNTER — Other Ambulatory Visit: Payer: Self-pay

## 2022-12-25 ENCOUNTER — Emergency Department (HOSPITAL_BASED_OUTPATIENT_CLINIC_OR_DEPARTMENT_OTHER): Payer: Medicaid Other

## 2022-12-25 DIAGNOSIS — B974 Respiratory syncytial virus as the cause of diseases classified elsewhere: Secondary | ICD-10-CM | POA: Insufficient documentation

## 2022-12-25 DIAGNOSIS — J205 Acute bronchitis due to respiratory syncytial virus: Secondary | ICD-10-CM | POA: Diagnosis not present

## 2022-12-25 DIAGNOSIS — R059 Cough, unspecified: Secondary | ICD-10-CM | POA: Insufficient documentation

## 2022-12-25 DIAGNOSIS — Z1152 Encounter for screening for COVID-19: Secondary | ICD-10-CM | POA: Insufficient documentation

## 2022-12-25 LAB — RESP PANEL BY RT-PCR (RSV, FLU A&B, COVID)  RVPGX2
Influenza A by PCR: NEGATIVE
Influenza B by PCR: NEGATIVE
Resp Syncytial Virus by PCR: POSITIVE — AB
SARS Coronavirus 2 by RT PCR: NEGATIVE

## 2022-12-25 LAB — CBG MONITORING, ED: Glucose-Capillary: 152 mg/dL — ABNORMAL HIGH (ref 70–99)

## 2022-12-25 MED ORDER — BENZONATATE 100 MG PO CAPS
200.0000 mg | ORAL_CAPSULE | Freq: Once | ORAL | Status: AC
  Administered 2022-12-25: 200 mg via ORAL
  Filled 2022-12-25: qty 2

## 2022-12-25 MED ORDER — ALBUTEROL SULFATE HFA 108 (90 BASE) MCG/ACT IN AERS
2.0000 | INHALATION_SPRAY | Freq: Once | RESPIRATORY_TRACT | Status: AC
  Administered 2022-12-25: 2 via RESPIRATORY_TRACT
  Filled 2022-12-25: qty 6.7

## 2022-12-25 NOTE — ED Triage Notes (Signed)
Pt c/o cough x3 days. Dry, no other symptoms.

## 2022-12-25 NOTE — ED Provider Notes (Signed)
[place] EMERGENCY DEPARTMENT AT [facility] Provider Note   CSN: [id number] Arrival date & time: 12/25/22  2035     History {Add pertinent medical, surgical, social history, OB history to HPI:1} Chief Complaint  Patient presents with   Cough    Brandon Woods is a 47 y.o. male.  Patient with no past medical history states dry cough for the past 3 days with some sore throat.  Cough is nonproductive.  Does no have any history of asthma or COPD.  No chest pain or shortness of breath.  No fever.  Some throat discomfort.  No abdominal pain, nausea or vomiting.  No runny nose or sore throat but has had some congestion.  Multiple sick contacts at home.  Denies any past medical history.  Does not smoke.  No history of asthma or COPD.  States cough is dry despite taking over-the-counter cough remedies at home.  The history is provided by the patient.  Cough Associated symptoms: rhinorrhea   Associated symptoms: no chest pain, no fever, no headaches, no myalgias and no rash        Home Medications Prior to Admission medications   Medication Sig Start Date End Date Taking? Authorizing Provider  Accu-Chek Softclix Lancets lancets Use as instructed 09/11/22   Prosperi, Christian H, PA-C  Blood Glucose Monitoring Suppl DEVI 1 each by Does not apply route in the morning, at noon, and at bedtime. May substitute to any manufacturer covered by patient's insurance. 09/11/22   Prosperi, Christian H, PA-C  empagliflozin (JARDIANCE) 10 MG TABS tablet Take 1 tablet (10 mg total) by mouth daily before breakfast. 11/03/22   Monna Fam, MD  EPINEPHrine 0.3 mg/0.3 mL IJ SOAJ injection Inject 0.3 mg into the muscle as needed for anaphylaxis. 07/19/21   Blane Ohara, MD  metFORMIN (GLUCOPHAGE) 500 MG tablet Take 2 tablets (1,000 mg total) by mouth 2 (two) times daily with a meal. 10/28/22   Monna Fam, MD  NON FORMULARY Eye drop for pink eye And a pill for his eye    [provider]   ondansetron (ZOFRAN-ODT) 4 MG disintegrating tablet Take 1 tablet (4 mg total) by mouth every 8 (eight) hours as needed for nausea or vomiting. 03/11/21   Mardella Layman, MD  dicyclomine (BENTYL) 20 MG tablet Take 1 tablet (20 mg total) by mouth 4 (four) times daily -  before meals and at bedtime. 01/01/18 11/24/18  Shaune Pollack, MD      Allergies    Patient has no known allergies.    Review of Systems   Review of Systems  Constitutional:  Negative for activity change, appetite change and fever.  HENT:  Positive for congestion and rhinorrhea.   Respiratory:  Positive for cough.   Cardiovascular:  Negative for chest pain.  Gastrointestinal:  Negative for abdominal pain, nausea and vomiting.  Genitourinary:  Negative for dysuria.  Musculoskeletal:  Negative for arthralgias and myalgias.  Skin:  Negative for rash.  Neurological:  Negative for dizziness, weakness and headaches.   all other systems are negative except as noted in the HPI and PMH.    Physical Exam Updated Vital Signs BP 121/75   Pulse 72   Temp 99.5 F (37.5 C)   Resp 19   SpO2 100%  Physical Exam Vitals and nursing note reviewed.  Constitutional:      General: He is not in acute distress.    Appearance: He is well-developed.  HENT:     Head: Normocephalic and atraumatic.  Mouth/Throat:     Pharynx: No oropharyngeal exudate.  Eyes:     Conjunctiva/sclera: Conjunctivae normal.     Pupils: Pupils are equal, round, and reactive to light.  Neck:     Comments: No meningismus. Cardiovascular:     Rate and Rhythm: Normal rate and regular rhythm.     Heart sounds: Normal heart sounds. No murmur heard. Pulmonary:     Effort: Pulmonary effort is normal. No respiratory distress.     Breath sounds: Normal breath sounds. No wheezing.  Abdominal:     Palpations: Abdomen is soft.     Tenderness: There is no abdominal tenderness. There is no guarding or rebound.  Musculoskeletal:        General: No tenderness.  Normal range of motion.     Cervical back: Normal range of motion and neck supple.  Skin:    General: Skin is warm.  Neurological:     Mental Status: He is alert and oriented to person, place, and time.     Cranial Nerves: No cranial nerve deficit.     Motor: No abnormal muscle tone.     Coordination: Coordination normal.     Comments:  5/5 strength throughout. CN 2-12 intact.Equal grip strength.   Psychiatric:        Behavior: Behavior normal.     ED Results / Procedures / Treatments   Labs (all labs ordered are listed, but only abnormal results are displayed) Labs Reviewed  RESP PANEL BY RT-PCR (RSV, FLU A&B, COVID)  RVPGX2 - Abnormal; Notable for the following components:      Result Value   Resp Syncytial Virus by PCR POSITIVE (*)    All other components within normal limits    EKG None  Radiology No results found.  Procedures Procedures  {Document cardiac monitor, telemetry assessment procedure when appropriate:1}  Medications Ordered in ED Medications  albuterol (VENTOLIN HFA) 108 (90 Base) MCG/ACT inhaler 2 puff (has no administration in time range)  benzonatate (TESSALON) capsule 200 mg (200 mg Oral Given 12/25/22 2308)    ED Course/ Medical Decision Making/ A&P   {   Click here for ABCD2, HEART and other calculatorsREFRESH Note before signing :1}                              Medical Decision Making Amount and/or Complexity of Data Reviewed Labs: ordered. Decision-making details documented in ED Course. Radiology: ordered and independent interpretation performed. Decision-making details documented in ED Course. ECG/medicine tests: ordered and independent interpretation performed. Decision-making details documented in ED Course.  Risk Prescription drug management.   3 days of cough. No hypoxia or increased work of breathing. No CP or SOB.  Respiratory swab was positive for RSV in triage.  No increased work of breathing, hypoxia or wheezing.  Will give  albuterol as well as Tessalon {Document critical care time when appropriate:1} {Document review of labs and clinical decision tools ie heart score, Chads2Vasc2 etc:1}  {Document your independent review of radiology images, and any outside records:1} {Document your discussion with family members, caretakers, and with consultants:1} {Document social determinants of health affecting pt's care:1} {Document your decision making why or why not admission, treatments were needed:1} Final Clinical Impression(s) / ED Diagnoses Final diagnoses:  None    Rx / DC Orders ED Discharge Orders     None

## 2022-12-26 MED ORDER — BENZONATATE 100 MG PO CAPS: 100.0000 mg | ORAL_CAPSULE | Freq: Three times a day (TID) | ORAL | 0 refills | Status: DC

## 2022-12-26 NOTE — Discharge Instructions (Signed)
Use the albuterol as needed for difficulty breathing. Keep yourself hydrated. Followup with your doctor. Return to the ED with exertional chest pain, pain associated with shortness of breath, nausea, vomiting, sweating or any other concerns.

## 2023-01-31 ENCOUNTER — Encounter: Payer: Medicaid Other | Admitting: Dietician

## 2023-01-31 ENCOUNTER — Encounter: Payer: Medicaid Other | Admitting: Student

## 2023-03-20 ENCOUNTER — Emergency Department (HOSPITAL_COMMUNITY)
Admission: EM | Admit: 2023-03-20 | Discharge: 2023-03-20 | Disposition: A | Discharge status: Home / Self Care | Attending: Emergency Medicine | Admitting: Emergency Medicine

## 2023-03-20 ENCOUNTER — Encounter (HOSPITAL_COMMUNITY): Payer: Self-pay | Admitting: Emergency Medicine

## 2023-03-20 ENCOUNTER — Other Ambulatory Visit: Payer: Self-pay

## 2023-03-20 DIAGNOSIS — H9202 Otalgia, left ear: Secondary | ICD-10-CM | POA: Diagnosis not present

## 2023-03-20 HISTORY — DX: Bell's palsy: G51.0

## 2023-03-20 NOTE — Discharge Instructions (Signed)
 Please be sure to follow-up with your physician.  Return here for concerning changes in your condition.

## 2023-03-20 NOTE — ED Provider Notes (Signed)
 [place] EMERGENCY DEPARTMENT AT [facility] Provider Note   CSN: [id number] Arrival date & time: 03/20/23  1849     History  Chief Complaint  Patient presents with   Otalgia    Brandon Woods is a 48 y.o. male.  HPI Presents after being seen and evaluated 2 days ago following facial asymmetry, being diagnosed with Bell's palsy.  He notes that he has been taking his medication as directed, today while test tachycardia felt sudden onset of left ear pain.  Pain is isolated to the ear, without neck pain, facial pain, hearing loss.  Pain resolved spontaneously and has not recurred.  He denies other complaints.    Home Medications Prior to Admission medications   Medication Sig Start Date End Date Taking? Authorizing Provider  Accu-Chek Softclix Lancets lancets Use as instructed 09/11/22   Prosperi, Christian H, PA-C  benzonatate (TESSALON) 100 MG capsule Take 1 capsule (100 mg total) by mouth every 8 (eight) hours. 12/26/22   Rancour, Jeannett Senior, MD  Blood Glucose Monitoring Suppl DEVI 1 each by Does not apply route in the morning, at noon, and at bedtime. May substitute to any manufacturer covered by patient's insurance. 09/11/22   Prosperi, Christian H, PA-C  empagliflozin (JARDIANCE) 10 MG TABS tablet Take 1 tablet (10 mg total) by mouth daily before breakfast. 11/03/22   Monna Fam, MD  EPINEPHrine 0.3 mg/0.3 mL IJ SOAJ injection Inject 0.3 mg into the muscle as needed for anaphylaxis. 07/19/21   Blane Ohara, MD  metFORMIN (GLUCOPHAGE) 500 MG tablet Take 2 tablets (1,000 mg total) by mouth 2 (two) times daily with a meal. 10/28/22   Monna Fam, MD  NON FORMULARY Eye drop for pink eye And a pill for his eye    [provider]  ondansetron (ZOFRAN-ODT) 4 MG disintegrating tablet Take 1 tablet (4 mg total) by mouth every 8 (eight) hours as needed for nausea or vomiting. 03/11/21   Mardella Layman, MD  dicyclomine (BENTYL) 20 MG tablet Take 1 tablet (20 mg total) by mouth  4 (four) times daily -  before meals and at bedtime. 01/01/18 11/24/18  Shaune Pollack, MD      Allergies    Patient has no known allergies.    Review of Systems   Review of Systems  Physical Exam Updated Vital Signs BP 122/74 (BP Location: Right Arm)   Pulse 62   Temp 98.1 F (36.7 C) (Oral)   Resp 18   Wt 90 kg   SpO2 100%   BMI 33.02 kg/m  Physical Exam Vitals and nursing note reviewed.  Constitutional:      General: He is not in acute distress.    Appearance: He is well-developed.  HENT:     Head: Normocephalic and atraumatic.   Eyes:     Conjunctiva/sclera: Conjunctivae normal.  Cardiovascular:     Rate and Rhythm: Regular rhythm.  Pulmonary:     Effort: Pulmonary effort is normal. No respiratory distress.     Breath sounds: No stridor.  Abdominal:     General: There is no distension.  Skin:    General: Skin is warm and dry.  Neurological:     Mental Status: He is alert and oriented to person, place, and time.     Cranial Nerves: Facial asymmetry present.     Comments: Bell's palsy apparent     ED Results / Procedures / Treatments   Labs (all labs ordered are listed, but only abnormal results are displayed) Labs  Reviewed - No data to display  EKG None  Radiology No results found.  Procedures Procedures    Medications Ordered in ED Medications - No data to display  ED Course/ Medical Decision Making/ A&P                                 Medical Decision Making Adult male presents with male presents with ongoing left facial asymmetry in the context of recent Bell's palsy diagnosis, now after episode of transient left ear pain.  Patient has no ongoing complaints is hemodynamically unremarkable, has no obvious abnormalities on physical exam, and given these reassuring features, patient discharged to follow-up with primary care.  Final Clinical Impression(s) / ED Diagnoses Final diagnoses:  Otalgia of left ear    Rx / DC Orders ED Discharge  Orders     None         Gerhard Munch, MD 03/20/23 1933

## 2023-03-20 NOTE — ED Triage Notes (Signed)
 Patient c/o left ear pain that went away.  Patient has a recent history of bells palsy and stated that he just wanted to get checked out.

## 2023-04-05 ENCOUNTER — Ambulatory Visit: Payer: Self-pay | Admitting: Student

## 2023-04-05 ENCOUNTER — Telehealth: Payer: Self-pay | Admitting: Internal Medicine

## 2023-04-05 VITALS — BP 117/73 | HR 63 | Temp 97.7°F | Ht 65.0 in | Wt 199.7 lb

## 2023-04-05 DIAGNOSIS — E119 Type 2 diabetes mellitus without complications: Secondary | ICD-10-CM | POA: Diagnosis not present

## 2023-04-05 DIAGNOSIS — E785 Hyperlipidemia, unspecified: Secondary | ICD-10-CM

## 2023-04-05 DIAGNOSIS — Z7984 Long term (current) use of oral hypoglycemic drugs: Secondary | ICD-10-CM | POA: Diagnosis not present

## 2023-04-05 DIAGNOSIS — G510 Bell's palsy: Secondary | ICD-10-CM | POA: Diagnosis not present

## 2023-04-05 DIAGNOSIS — Z1211 Encounter for screening for malignant neoplasm of colon: Secondary | ICD-10-CM

## 2023-04-05 DIAGNOSIS — J302 Other seasonal allergic rhinitis: Secondary | ICD-10-CM | POA: Insufficient documentation

## 2023-04-05 LAB — GLUCOSE, CAPILLARY: Glucose-Capillary: 87 mg/dL (ref 70–99)

## 2023-04-05 LAB — POCT GLYCOSYLATED HEMOGLOBIN (HGB A1C): Hemoglobin A1C: 5.6 % (ref 4.0–5.6)

## 2023-04-05 MED ORDER — CETIRIZINE HCL 10 MG PO TABS
10.0000 mg | ORAL_TABLET | Freq: Every day | ORAL | 3 refills | Status: AC
Start: 2023-04-05 — End: 2024-04-04

## 2023-04-05 MED ORDER — EMPAGLIFLOZIN 10 MG PO TABS: 10.0000 mg | ORAL_TABLET | Freq: Every day | ORAL | 5 refills | Status: DC

## 2023-04-05 MED ORDER — METFORMIN HCL 1000 MG PO TABS: 1000.0000 mg | ORAL_TABLET | Freq: Every day | ORAL | 3 refills | Status: AC

## 2023-04-05 MED ORDER — METFORMIN HCL 1000 MG PO TABS: 1000.0000 mg | ORAL_TABLET | Freq: Two times a day (BID) | ORAL | 3 refills | Status: DC

## 2023-04-05 MED ORDER — ATORVASTATIN CALCIUM 20 MG PO TABS: 20.0000 mg | ORAL_TABLET | Freq: Every day | ORAL | 3 refills | Status: AC

## 2023-04-05 NOTE — Telephone Encounter (Signed)
 Patient was contacted due to appointment being scheduled this afternoon with [name] and not a doctor for medical issues.  Appointment was moved from 3:15 pm with [name] to Dr. Sloan Leiter at 2:45 pm on 04/05/23.  Patient is made aware of new appointment time, so he should be on time for his appointment this afternoon.

## 2023-04-05 NOTE — Patient Instructions (Addendum)
 Thank you, Mr.Brixon Zhen for allowing [place] to provide your care today.  I have ordered the following tests for you:  Lab Orders         Glucose, capillary         Cologuard         POC Hbg A1C       I have ordered the following medication/changed the following medications:   Start the following medications:  Metformin 1000mg  once daily Atorvastatin 20mg  once daily Zyrtec 10mg  daily as needed for allergies      Follow up: 3 months for diabetes follow up   We look forward to seeing you next time. Please call our clinic at [phone] if you have any questions or concerns. The best time to call is Monday-Friday from 9am-4pm, but there is someone available 24/7. If after hours or the weekend, call the main hospital number and ask for the Internal Medicine Resident On-Call. If you need medication refills, please notify your pharmacy one week in advance and they will send [place] a request.   Thank you for trusting me with your care. Wishing you the best!  Lovie Macadamia MD [facility]

## 2023-04-05 NOTE — Progress Notes (Unsigned)
 Subjective:  CC: Diabetes follow-up, Bell's palsy  HPI:  Mr.Brandon Woods is a 47 y.o. person with a past medical history stated below and presents today for the stated chief complaint. Please see problem based assessment and plan for additional details.  Past Medical History:  Diagnosis Date   Bell's palsy     Current Outpatient Medications on File Prior to Visit  Medication Sig Dispense Refill   Accu-Chek Softclix Lancets lancets Use as instructed 100 each 12   Blood Glucose Monitoring Suppl DEVI 1 each by Does not apply route in the morning, at noon, and at bedtime. May substitute to any manufacturer covered by patient's insurance. 1 each 0   EPINEPHrine 0.3 mg/0.3 mL IJ SOAJ injection Inject 0.3 mg into the muscle as needed for anaphylaxis. 1 each 0   NON FORMULARY Eye drop for pink eye And a pill for his eye     ondansetron (ZOFRAN-ODT) 4 MG disintegrating tablet Take 1 tablet (4 mg total) by mouth every 8 (eight) hours as needed for nausea or vomiting. 15 tablet 0   No current facility-administered medications on file prior to visit.    Review of Systems: Please see assessment and plan for pertinent positives and negatives.  Objective:   Vitals:   04/05/23 1530  BP: 117/73  Pulse: 63  Temp: 97.7 F (36.5 C)  TempSrc: Oral  SpO2: 96%  Weight: 199 lb 11.2 oz (90.6 kg)  Height: 5\' 5"  (1.651 m)    Physical Exam: Constitutional: Well-appearing Cardiovascular: Regular rate and rhythm Pulmonary/Chest: lungs clear to auscultation bilaterally Abdominal: soft, non-tender, non-distended Extremities: No edema of the lower extremities bilaterally.  Palpable pulses of the lower extremities bilaterally. Psych: Pleasant affect Thought process is linear and is goal-directed.   Neuro: Slight motor facial asymmetry, most consistent with Bell's palsy.  Upper extremity and lower extremity strength, sensation symmetric and appropriate.  Assessment & Plan:  Diabetes mellitus,  type 2 (HCC) A1c 8.9 on 10/28/2023, at that time metformin was increased to 1 g twice daily, Jardiance 10 mg daily was started.  Patient states he has been taking his metformin 1 g twice daily, but was unaware of the need for Jardiance.  His LDL was above goal last visit, not currently on a statin.  A1c obtained today with excellent improvement, A1c 5.6.  Congratulated patient on his efforts, will reduce metformin to 1 g daily.  He is not having any side effects at this time.  Will discontinue Jardiance. Plan: Metformin 1 g daily Diabetic foot exam today Optho referral today Initiate Lipitor 20 mg daily   Bell's palsy Seen in [facility] on 03/12/2023 for ongoing left-sided facial droop since 03/10/2023. Was prescribed valacyclovir and prednisone taper, using artificial tears and eye patch at home.  Was again seen on 03/20/2023 in [facility], ED with transient left ear pain which self resolved.  Evaluated today for his Bell's palsy.  Patient states that his symptoms have largely resolved, he is feeling much better.  Denies any ear pain at this time, though he says things sometimes sound louder in his left ear.  He is still using artificial tears at times, but he is able to close the eye.  Slight facial asymmetry on exam today, though improving compared to prior documentation.  Denies any headache, dizziness, weakness, focal sensation loss, word finding difficulties.  Low concern for underlying stroke.  Discussed returning to our clinic should symptoms fail to resolve with additional time or begin to  worsen again.  Screening for colon cancer Not interested in colonoscopy at this time, though would be willing to do Cologuard.  Discussed with patient that should his Cologuard result positive, colonoscopy would be recommended.  Patient demonstrated understanding of this.   Patient discussed with Dr. Elliot Cousin MD [facility]  PGY-1 Pager: [phone]   Phone: [phone] Date 04/06/2023  Time 2:16 PM

## 2023-04-06 DIAGNOSIS — Z1211 Encounter for screening for malignant neoplasm of colon: Secondary | ICD-10-CM | POA: Insufficient documentation

## 2023-04-06 DIAGNOSIS — G510 Bell's palsy: Secondary | ICD-10-CM | POA: Insufficient documentation

## 2023-04-06 NOTE — Assessment & Plan Note (Addendum)
 A1c 8.9 on 10/28/2023, at that time metformin was increased to 1 g twice daily, Jardiance 10 mg daily was started.  Patient states he has been taking his metformin 1 g twice daily, but was unaware of the need for Jardiance.  His LDL was above goal last visit, not currently on a statin.  A1c obtained today with excellent improvement, A1c 5.6.  Congratulated patient on his efforts, will reduce metformin to 1 g daily.  He is not having any side effects at this time.  Will discontinue Jardiance. Plan: Metformin 1 g daily Diabetic foot exam today Optho referral today Initiate Lipitor 20 mg daily

## 2023-04-06 NOTE — Assessment & Plan Note (Signed)
 Not interested in colonoscopy at this time, though would be willing to do Cologuard.  Discussed with patient that should his Cologuard result positive, colonoscopy would be recommended.  Patient demonstrated understanding of this.

## 2023-04-06 NOTE — Assessment & Plan Note (Addendum)
 Seen in [facility] on 03/12/2023 for ongoing left-sided facial droop since 03/10/2023. Was prescribed valacyclovir and prednisone taper, using artificial tears and eye patch at home.  Was again seen on 03/20/2023 in [facility], ED with transient left ear pain which self resolved.  Evaluated today for his Bell's palsy.  Patient states that his symptoms have largely resolved, he is feeling much better.  Denies any ear pain at this time, though he says things sometimes sound louder in his left ear.  He is still using artificial tears at times, but he is able to close the eye.  Slight facial asymmetry on exam today, though improving compared to prior documentation.  Denies any headache, dizziness, weakness, focal sensation loss, word finding difficulties.  Low concern for underlying stroke.  Discussed returning to our clinic should symptoms fail to resolve with additional time or begin to worsen again.

## 2023-04-07 NOTE — Progress Notes (Signed)
 Internal Medicine Clinic Attending  Case discussed with the resident at the time of the visit.  We reviewed the resident's history and exam and pertinent patient test results.  I agree with the assessment, diagnosis, and plan of care documented in the resident's note.

## 2023-04-28 DIAGNOSIS — Z1211 Encounter for screening for malignant neoplasm of colon: Secondary | ICD-10-CM | POA: Diagnosis not present

## 2023-05-05 LAB — COLOGUARD: COLOGUARD: NEGATIVE

## 2023-06-28 IMAGING — DX DG CHEST 2V
2 series · 2 of 2 positions shown · non-contrast
Comparison: 07/16/2020, 04/28/2019

CLINICAL DATA: Cough

EXAM:
CHEST - 2 VIEW

[chest pa]
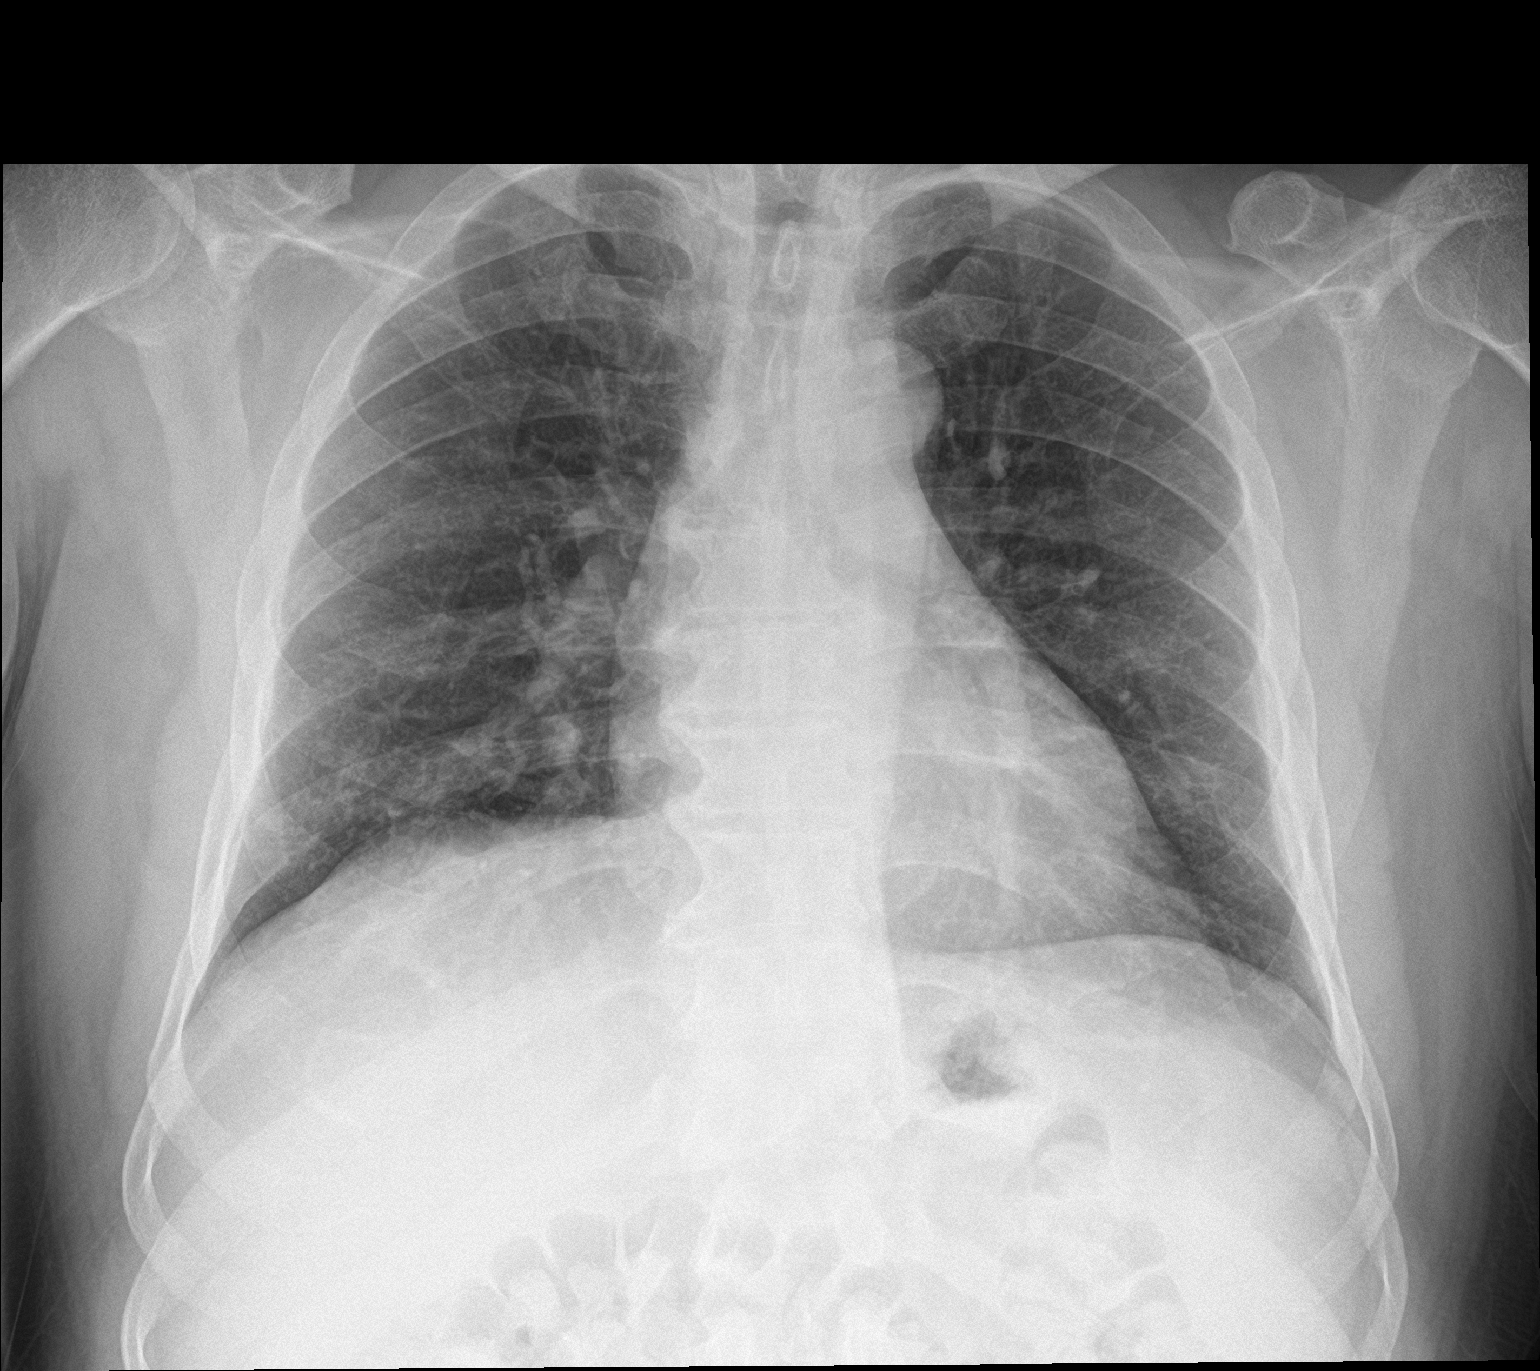

[chest lat]
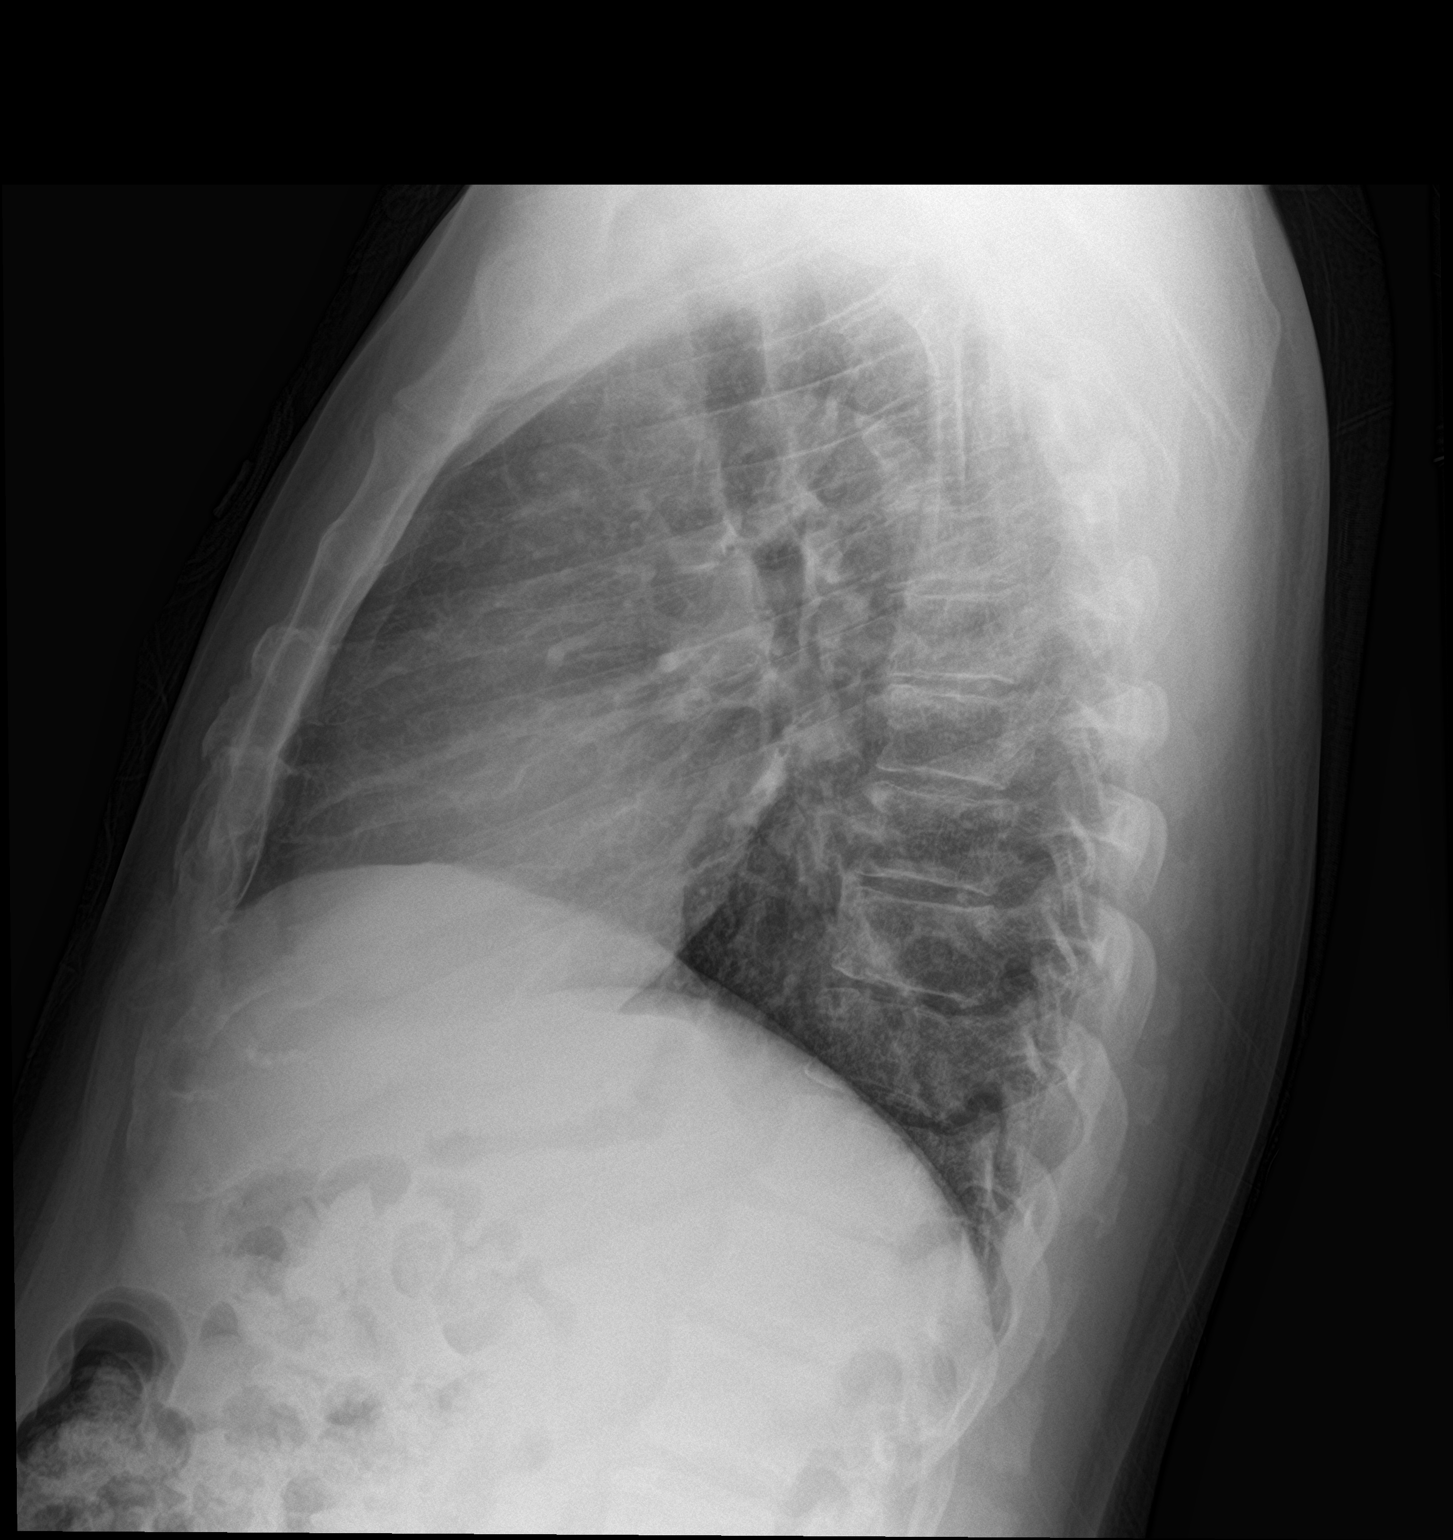

[2 of 2 positions shown; findings below may reference images not displayed]

FINDINGS: The heart size and mediastinal contours are within normal limits.
Both lungs are clear. The visualized skeletal structures are
unremarkable.
IMPRESSION: No active cardiopulmonary disease.
